# Patient Record
Sex: Female | Born: 1960 | Race: White | Hispanic: No | Marital: Married | Smoking: Never smoker
Health system: Southern US, Community
[De-identification: ages and names within clinical notes are randomized; demographics above are authoritative.]

## PROBLEM LIST (undated history)

## (undated) DIAGNOSIS — J45909 Unspecified asthma, uncomplicated: Secondary | ICD-10-CM

## (undated) DIAGNOSIS — G039 Meningitis, unspecified: Secondary | ICD-10-CM

## (undated) DIAGNOSIS — D72819 Decreased white blood cell count, unspecified: Principal | ICD-10-CM

## (undated) DIAGNOSIS — D709 Neutropenia, unspecified: Secondary | ICD-10-CM

## (undated) DIAGNOSIS — E039 Hypothyroidism, unspecified: Secondary | ICD-10-CM

## (undated) HISTORY — DX: Decreased white blood cell count, unspecified: D72.819

## (undated) HISTORY — DX: Unspecified asthma, uncomplicated: J45.909

## (undated) HISTORY — DX: Neutropenia, unspecified: D70.9

## (undated) HISTORY — DX: Hypothyroidism, unspecified: E03.9

## (undated) HISTORY — DX: Meningitis, unspecified: G03.9

---
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
## 1996-08-07 HISTORY — PX: THYROIDECTOMY, PARTIAL: SHX18

## 2004-11-22 ENCOUNTER — Ambulatory Visit (HOSPITAL_COMMUNITY): Admission: RE | Admit: 2004-11-22 | Discharge: 2004-11-22 | Payer: Self-pay | Admitting: Internal Medicine

## 2005-08-04 ENCOUNTER — Ambulatory Visit (HOSPITAL_COMMUNITY): Admission: RE | Admit: 2005-08-04 | Discharge: 2005-08-04 | Payer: Self-pay | Admitting: Internal Medicine

## 2005-08-07 HISTORY — PX: CHOLECYSTECTOMY: SHX55

## 2005-08-09 ENCOUNTER — Ambulatory Visit (HOSPITAL_COMMUNITY): Admission: RE | Admit: 2005-08-09 | Discharge: 2005-08-09 | Payer: Self-pay | Admitting: Family Medicine

## 2005-08-14 ENCOUNTER — Encounter (HOSPITAL_COMMUNITY): Admission: RE | Admit: 2005-08-14 | Discharge: 2005-09-13 | Payer: Self-pay | Admitting: Internal Medicine

## 2005-11-03 ENCOUNTER — Ambulatory Visit (HOSPITAL_COMMUNITY): Admission: RE | Admit: 2005-11-03 | Discharge: 2005-11-03 | Payer: Self-pay | Admitting: General Surgery

## 2005-11-03 ENCOUNTER — Encounter (INDEPENDENT_AMBULATORY_CARE_PROVIDER_SITE_OTHER): Payer: Self-pay | Admitting: Specialist

## 2005-12-07 ENCOUNTER — Ambulatory Visit (HOSPITAL_COMMUNITY): Admission: RE | Admit: 2005-12-07 | Discharge: 2005-12-07 | Payer: Self-pay | Admitting: Internal Medicine

## 2007-01-07 ENCOUNTER — Ambulatory Visit (HOSPITAL_COMMUNITY): Admission: RE | Admit: 2007-01-07 | Discharge: 2007-01-07 | Payer: Self-pay | Admitting: Internal Medicine

## 2007-08-08 HISTORY — PX: COLONOSCOPY: SHX174

## 2007-09-03 ENCOUNTER — Ambulatory Visit (HOSPITAL_COMMUNITY): Admission: RE | Admit: 2007-09-03 | Discharge: 2007-09-03 | Payer: Self-pay | Admitting: General Surgery

## 2008-01-29 ENCOUNTER — Ambulatory Visit (HOSPITAL_COMMUNITY): Admission: RE | Admit: 2008-01-29 | Discharge: 2008-01-29 | Payer: Self-pay | Admitting: Internal Medicine

## 2009-01-29 ENCOUNTER — Ambulatory Visit (HOSPITAL_COMMUNITY): Admission: RE | Admit: 2009-01-29 | Discharge: 2009-01-29 | Payer: Self-pay | Admitting: Internal Medicine

## 2009-03-02 ENCOUNTER — Ambulatory Visit (HOSPITAL_COMMUNITY): Admission: RE | Admit: 2009-03-02 | Discharge: 2009-03-02 | Payer: Self-pay | Admitting: Internal Medicine

## 2010-01-31 ENCOUNTER — Ambulatory Visit (HOSPITAL_COMMUNITY): Admission: RE | Admit: 2010-01-31 | Discharge: 2010-01-31 | Payer: Self-pay | Admitting: Internal Medicine

## 2010-02-22 ENCOUNTER — Ambulatory Visit (HOSPITAL_COMMUNITY): Admission: RE | Admit: 2010-02-22 | Discharge: 2010-02-22 | Payer: Self-pay | Admitting: Internal Medicine

## 2010-10-22 LAB — BLOOD GAS, ARTERIAL
Acid-base deficit: 1.5 mmol/L (ref 0.0–2.0)
Bicarbonate: 22.3 mEq/L (ref 20.0–24.0)
Patient temperature: 37
TCO2: 19.6 mmol/L (ref 0–100)
pCO2 arterial: 34.5 mmHg — ABNORMAL LOW (ref 35.0–45.0)
pH, Arterial: 7.426 — ABNORMAL HIGH (ref 7.350–7.400)

## 2010-12-20 NOTE — H&P (Signed)
Stephanie Valencia, Stephanie Valencia                 ACCOUNT NO.:  [id number]   MEDICAL RECORD NO.:  [id number]          PATIENT TYPE:  AMB   LOCATION:  DAY                           FACILITY:  [facility]   PHYSICIAN:  Dalia Heading, M.D.  DATE OF BIRTH:  September 10, 1960   DATE OF ADMISSION:  DATE OF DISCHARGE:  LH                              HISTORY & PHYSICAL   CHIEF COMPLAINT:  Hematochezia.   HISTORY OF PRESENT ILLNESS:  The patient is a 50 year old white female  who is referred for endoscopic evaluation.  She needs a colonoscopy for  hematochezia.  She has been passing some blood per rectum over the past  few weeks.  No abdominal pain, weight loss, nausea, vomiting, diarrhea,  constipation, or melena have been noted.  She has never had a  colonoscopy.  There is no family history of colon carcinoma.   PAST MEDICAL HISTORY:  Includes hypertension.   PAST SURGICAL HISTORY:  Laparoscopic cholecystectomy, thyroidectomy, C-  section, tonsillectomy.   CURRENT MEDICATIONS:  Loestrin.   ALLERGIES:  BACTRIM AND CATGUT.   REVIEW OF SYSTEMS:  Noncontributory.   PHYSICAL EXAMINATION:  GENERAL:  The patient is a well-developed, well-  nourished white female in no acute distress.  LUNGS:  Clear to auscultation with equal breath sounds bilaterally.  HEART:  Examination reveals regular rate and rhythm without history, S4,  murmurs.  ABDOMEN:  Soft, nontender, nondistended.  No hepatosplenomegaly or  masses noted.  RECTAL:  Examination was deferred to the procedure.   IMPRESSION:  Hematochezia.   PLAN:  The patient is scheduled for colonoscopy on September 03, 2007.  The risks and benefits of the procedure, including bleeding and  perforation, were fully explained to the patient, who gave informed  consent.      Dalia Heading, M.D.  Electronically Signed     MAJ/MEDQ  D:  08/27/2007  T:  08/27/2007  Job:  [id number]   cc:   Madelin Rear. Sherwood Gambler, MD  Fax: [phone]

## 2010-12-23 NOTE — Op Note (Signed)
Stephanie Valencia, Stephanie Valencia                 ACCOUNT NO.:  [id number]   MEDICAL RECORD NO.:  [id number]          PATIENT TYPE:  AMB   LOCATION:  DAY                           FACILITY:  [facility]   PHYSICIAN:  Dalia Heading, M.D.  DATE OF BIRTH:  21-Oct-1960   DATE OF PROCEDURE:  11/03/2005  DATE OF DISCHARGE:                                 OPERATIVE REPORT   PREOPERATIVE DIAGNOSIS:  Cholecystitis, cholelithiasis.   POSTOPERATIVE DIAGNOSIS:  Cholecystitis, cholelithiasis.   PROCEDURE:  Laparoscopic cholecystectomy.   SURGEON:  Dr. Franky Macho.   ASSISTANT:  Dr. Arna Snipe.   ANESTHESIA:  General endotracheal.   INDICATIONS:  The patient is a 50 year old white female who presents with a  biliary colic secondary to cholelithiasis.  Risks and benefits of the  procedure including bleeding, infection, hepatobiliary injury and the  possibility of an open procedure were fully explained to the patient, who  gave informed consent.   PROCEDURE NOTE:  The patient was placed in supine position.  After induction  of general endotracheal anesthesia, the abdomen was prepped and draped using  the usual sterile technique with Betadine.  Surgical site confirmation was  performed.   An infraumbilical incision was made down to fascia.  Veress needle was  introduced into the abdominal cavity and confirmation of placement was done  using the saline drop test.  The abdomen was then insufflated to 16 mmHg  pressure.  An 11-mm trocar was introduced into the abdominal cavity under  direct visualization without difficulty.  The patient was placed in reversed  Trendelenburg position.  An additional 11-mm trocar was placed epigastric  region, and 5-mm trocars were placed in the right upper quadrant and right  flank regions.  Liver was inspected and noted to normal limits.  The  gallbladder was retracted superiorly and laterally.  Dissection was begun  around the infundibulum of the gallbladder.  The cystic  duct was first  identified.  Its juncture to the infundibulum fully identified.  Endoclips  were placed proximally and distally on cystic duct, and the cystic duct was  divided.  This was likewise done to the cystic artery.  The gallbladder then  freed away from the gallbladder fossa using Bovie electrocautery.  The  gallbladder delivered through the epigastric trocar site using an EndoCatch  bag.  The gallbladder fossa was inspected, and no abnormal bleeding or bile  leakage was noted.  Surgicel was placed in the gallbladder fossa.  All fluid  and air were then evacuated from the abdominal cavity prior to removal of  the trocars.   All wounds were irrigated with normal saline.  All wounds were injected with  0.5%Sensorcaine.  The infraumbilical fascia was reapproximated using a 0  Vicryl interrupted suture.  All skin incisions were closed using staples.  Betadine ointment and dry sterile dressings were applied.   All tape and needle counts were correct at the end of the procedure. The  patient was extubated in the operating room and went back to recovery room  awake in stable condition.   COMPLICATIONS:  None.  SPECIMEN:  Gallbladder.   BLOOD LOSS:  Minimal.      Dalia Heading, M.D.  Electronically Signed     MAJ/MEDQ  D:  11/03/2005  T:  11/04/2005  Job:  [id number]

## 2010-12-23 NOTE — H&P (Signed)
Stephanie Valencia, Stephanie Valencia                 ACCOUNT NO.:  [id number]   MEDICAL RECORD NO.:  [id number]          PATIENT TYPE:  AMB   LOCATION:  DAY                           FACILITY:  [facility]   PHYSICIAN:  Dalia Heading, M.D.  DATE OF BIRTH:  11/18/1960   DATE OF ADMISSION:  DATE OF DISCHARGE:  LH                                HISTORY & PHYSICAL   CHIEF COMPLAINT:  Cholecystitis, cholelithiasis.   HISTORY OF PRESENT ILLNESS:  The patient is a 50 year old white female who  was referred for evaluation and treatment of biliary colic secondary to  cholelithiasis.  She has been having intermittent episodes of right upper  quadrant abdominal pain with radiation to the right flank, nausea,  indigestion and bloating for many months.  Fatty food makes it worse.  No  fever, chills or jaundice have been noted.   PAST MEDICAL HISTORY:  Hypertension.   PAST SURGICAL HISTORY:  Tonsillectomy, C-section, thyroidectomy.   CURRENT MEDICATIONS:  ___________ 1.5 - 30 p.o. q.d., Nexium 40 mg p.o.  b.i.d.   ALLERGIES:  BACTRIM and CATGUT SUTURES.   REVIEW OF SYSTEMS:  The patient denies drinking or smoking.   PHYSICAL EXAMINATION:  GENERAL:  On physical examination, the patient is a  well-developed, well-nourished white female in no acute distress.  HEENT:  HEENT examination reveals no scleral icterus.  LUNGS:  Clear to auscultation with equal breath sounds bilaterally.  Heart  examination reveals regular rate and rhythm without S3, S4 or murmurs.  ABDOMEN:  The abdomen is soft with slight tenderness in the right upper  quadrant to palpation.  No hepatosplenomegaly, masses or hernias are  identified.   STUDIES:  Ultrasound of the gallbladder reveals sludge with a normal common  bile duct.   IMPRESSION:  Biliary colic, cholelithiasis.   PLAN:  The patient is scheduled for laparoscopic cholecystectomy on November 03, 2005.  Risks and benefits of the procedure including bleeding,  infection, hepatobiliary  injury and the possibility of an open procedure  were fully explained to the patient, who gave informed consent.      Dalia Heading, M.D.  Electronically Signed     MAJ/MEDQ  D:  11/02/2005  T:  11/02/2005  Job:  [id number]   cc:   [facility]  Fax: [phone]   Madelin Rear. Sherwood Gambler, MD  Fax: [phone]

## 2011-01-09 ENCOUNTER — Other Ambulatory Visit (HOSPITAL_COMMUNITY): Payer: Self-pay | Admitting: Internal Medicine

## 2011-01-09 DIAGNOSIS — Z139 Encounter for screening, unspecified: Secondary | ICD-10-CM

## 2011-02-14 ENCOUNTER — Ambulatory Visit (HOSPITAL_COMMUNITY)
Admission: RE | Admit: 2011-02-14 | Discharge: 2011-02-14 | Disposition: A | Discharge status: Home / Self Care | Payer: BC Managed Care – PPO | Source: Ambulatory Visit | Attending: Internal Medicine | Admitting: Internal Medicine

## 2011-02-14 DIAGNOSIS — Z139 Encounter for screening, unspecified: Secondary | ICD-10-CM

## 2011-02-14 DIAGNOSIS — Z1231 Encounter for screening mammogram for malignant neoplasm of breast: Secondary | ICD-10-CM | POA: Insufficient documentation

## 2011-07-24 ENCOUNTER — Other Ambulatory Visit (HOSPITAL_COMMUNITY): Payer: Self-pay | Admitting: Internal Medicine

## 2011-07-24 ENCOUNTER — Ambulatory Visit (HOSPITAL_COMMUNITY)
Admission: RE | Admit: 2011-07-24 | Discharge: 2011-07-24 | Disposition: A | Discharge status: Home / Self Care | Payer: BC Managed Care – PPO | Source: Ambulatory Visit | Attending: Internal Medicine | Admitting: Internal Medicine

## 2011-07-24 DIAGNOSIS — J069 Acute upper respiratory infection, unspecified: Secondary | ICD-10-CM | POA: Insufficient documentation

## 2011-07-24 DIAGNOSIS — J189 Pneumonia, unspecified organism: Secondary | ICD-10-CM

## 2011-07-24 DIAGNOSIS — R059 Cough, unspecified: Secondary | ICD-10-CM | POA: Insufficient documentation

## 2011-07-24 DIAGNOSIS — R05 Cough: Secondary | ICD-10-CM | POA: Insufficient documentation

## 2012-01-18 ENCOUNTER — Other Ambulatory Visit (HOSPITAL_COMMUNITY): Payer: Self-pay | Admitting: Internal Medicine

## 2012-01-18 DIAGNOSIS — Z139 Encounter for screening, unspecified: Secondary | ICD-10-CM

## 2012-02-16 ENCOUNTER — Ambulatory Visit (HOSPITAL_COMMUNITY)
Admission: RE | Admit: 2012-02-16 | Discharge: 2012-02-16 | Disposition: A | Discharge status: Home / Self Care | Payer: BC Managed Care – PPO | Source: Ambulatory Visit | Attending: Internal Medicine | Admitting: Internal Medicine

## 2012-02-16 DIAGNOSIS — Z139 Encounter for screening, unspecified: Secondary | ICD-10-CM

## 2012-02-16 DIAGNOSIS — Z1231 Encounter for screening mammogram for malignant neoplasm of breast: Secondary | ICD-10-CM | POA: Insufficient documentation

## 2013-01-27 ENCOUNTER — Other Ambulatory Visit (HOSPITAL_COMMUNITY): Payer: Self-pay | Admitting: Internal Medicine

## 2013-01-27 DIAGNOSIS — Z139 Encounter for screening, unspecified: Secondary | ICD-10-CM

## 2013-02-17 ENCOUNTER — Ambulatory Visit (HOSPITAL_COMMUNITY)
Admission: RE | Admit: 2013-02-17 | Discharge: 2013-02-17 | Disposition: A | Discharge status: Home / Self Care | Payer: BC Managed Care – PPO | Source: Ambulatory Visit | Attending: Internal Medicine | Admitting: Internal Medicine

## 2013-02-17 DIAGNOSIS — Z1231 Encounter for screening mammogram for malignant neoplasm of breast: Secondary | ICD-10-CM | POA: Insufficient documentation

## 2013-02-17 DIAGNOSIS — Z139 Encounter for screening, unspecified: Secondary | ICD-10-CM

## 2013-02-26 ENCOUNTER — Encounter (HOSPITAL_COMMUNITY): Payer: Self-pay

## 2013-02-26 ENCOUNTER — Encounter (HOSPITAL_COMMUNITY): Payer: BC Managed Care – PPO | Attending: Hematology and Oncology

## 2013-02-26 VITALS — BP 129/79 | HR 81 | Temp 98.2°F | Resp 18 | Ht 63.5 in | Wt 144.3 lb

## 2013-02-26 DIAGNOSIS — D709 Neutropenia, unspecified: Secondary | ICD-10-CM | POA: Insufficient documentation

## 2013-02-26 LAB — COMPREHENSIVE METABOLIC PANEL
ALT: 15 U/L (ref 0–35)
Alkaline Phosphatase: 58 U/L (ref 39–117)
BUN: 15 mg/dL (ref 6–23)
CO2: 32 mEq/L (ref 19–32)
GFR calc Af Amer: >90 mL/min (ref >90)
GFR calc non Af Amer: 82 mL/min — ABNORMAL LOW (ref >90)
Glucose, Bld: 89 mg/dL (ref 70–99)
Potassium: 3.7 mEq/L (ref 3.5–5.1)
Sodium: 139 mEq/L (ref 135–145)
Total Bilirubin: 0.9 mg/dL (ref 0.3–1.2)

## 2013-02-26 LAB — CBC WITH DIFFERENTIAL/PLATELET
Eosinophils Absolute: 0.1 10*3/uL (ref 0.0–0.7)
Hemoglobin: 13.9 g/dL (ref 12.0–15.0)
Lymphocytes Relative: 43 % (ref 12–46)
Lymphs Abs: 1.5 10*3/uL (ref 0.7–4.0)
MCH: 30.2 pg (ref 26.0–34.0)
MCV: 90.2 fL (ref 78.0–100.0)
Monocytes Relative: 8 % (ref 3–12)
Neutrophils Relative %: 47 % (ref 43–77)
Platelets: 244 10*3/uL (ref 150–400)
RBC: 4.61 MIL/uL (ref 3.87–5.11)
WBC: 3.6 10*3/uL — ABNORMAL LOW (ref 4.0–10.5)

## 2013-02-26 NOTE — Patient Instructions (Addendum)
.  [facility] Discharge Instructions  RECOMMENDATIONS MADE BY THE CONSULTANT AND ANY TEST RESULTS WILL BE SENT TO YOUR REFERRING PHYSICIAN.  EXAM FINDINGS BY THE PHYSICIAN TODAY AND SIGNS OR SYMPTOMS TO REPORT TO CLINIC OR PRIMARY PHYSICIAN:  Labs today and then weekly  This may be a normal finding for you. 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  To afford each patient quality time with our providers, please arrive at least 15 minutes before your scheduled appointment time.  With your help, our goal is to use those 15 minutes to complete the necessary work-up to ensure our physicians have the information they need to help with your evaluation and healthcare recommendations.    Effective January 1st, 2014, we ask that you re-schedule your appointment with our physicians should you arrive 10 or more minutes late for your appointment.  We strive to give you quality time with our providers, and arriving late affects you and other patients whose appointments are after yours.    Again, thank you for choosing [facility].  Our hope is that these requests will decrease the amount of time that you wait before being seen by our physicians.       _____________________________________________________________  Should you have questions after your visit to [facility], please contact our office at [phone] between the hours of 8:30 a.m. and 5:00 p.m.  Voicemails left after 4:30 p.m. will not be returned until the following business day.  For prescription refill requests, have your pharmacy contact our office with your prescription refill request.

## 2013-02-26 NOTE — Progress Notes (Signed)
Consepcion L Postlethwait presented for labwork. Labs per MD order drawn via Peripheral Line 25 gauge needle inserted in lt ac.  Good blood return present. Procedure without incident.  Needle removed intact. Patient tolerated procedure well.

## 2013-02-26 NOTE — Progress Notes (Addendum)
Patient History and Physical   Stephanie Valencia [id number] [birth year] 51 y.o. 02/26/2013  Referring MD: Elfredia Nevins M.D.   Chief Complaint: Neutropenia  HPI:  Stephanie Valencia is a 65 year old woman, in generally good health who was referred by Dr. Sherwood Gambler for concern of neutropenia.  Patient tells me that she usually has a yearly  history or physical and each time she has been noted to have a low white count.  This effect I do see, that CBC was done dated 02/23/2012 which showed a WBC of 2.8k and absolute neutrophil count of 0.8 thousand normal hemoglobin of 13.8g/dL and normal platelet count 261,000. More recently patient had a CBC on 02/21/2013 which will WBC of 2.2 K absolute granulocyte of 1000, otherwise normal differential wbc counts .  At this time she was also noted to have hemoglobin of 14g/dL and platelet count 147,829.  Overall she tells me that she has not had any chronic or recurrent infections except for UTI which she says she usually gets treated for over the last 4-5 years when she goes for her routine physical.  She states that she completed a course of Cipro but was told that she still had the infection.  Regardless, she denies any urinary symptoms prior to or post antibiotics therapy. She did state history of pneumonia in the recent past reportedly 2 years ago. 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  I do not have historical CBC results beyond July 2013.  Patient denies fever or chills, denies lymphadenopathy, denies any significant weight loss or night sweats.  PMH: Past Medical History  Diagnosis Date  . Meningitis 1962    cut down for IV's  . Asthma     uses inhaler about twice a year    Past Surgical History  Procedure Laterality Date  . Tonsillectomy  1962  . Cesarean section  1982  . Thyroidectomy, partial Left 1998  . Colonoscopy  2009    Allergies: Allergies  not on file  Medications: Current outpatient prescriptions:calcium carbonate (OS-CAL) 600 MG TABS, Take 600 mg by mouth daily with breakfast., Disp: , Rfl: ;  cholecalciferol (VITAMIN D) 400 UNITS TABS, Take 400 Units by mouth daily., Disp: , Rfl: ;  fish oil-omega-3 fatty acids 1000 MG capsule, Take 2 g by mouth daily. Takes 1-2 gms daily, Disp: , Rfl: ;  levothyroxine (SYNTHROID, LEVOTHROID) 25 MCG tablet, Take 25 mcg by mouth daily before breakfast., Disp: , Rfl:  Lysine 500 MG TABS, Take 2 tablets by mouth daily., Disp: , Rfl: ;  magnesium oxide (MAG-OX) 400 MG tablet, Take 400 mg by mouth daily., Disp: , Rfl: ;  Multiple Vitamins-Minerals (EQ COMPLETE MULTIVIT ADULT 50+ PO), Take 1 capsule by mouth., Disp: , Rfl: ;  pramipexole (MIRAPEX) 0.25 MG tablet, Take 0.25 mg by mouth 3 (three) times daily. Takes 1 and 1/2 tablets daily, Disp: , Rfl:    Social History:   reports that she has never smoked. She has never used smokeless tobacco. She reports that she does not drink alcohol or use illicit drugs. 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  She is married with a child and lives with her husband.  She denies any history of IV drug use or high risk lifestyle or HIV risk factors.  Family History: She denies any family history of malignancy except in the  paternal grandmother who had lung cancer Patient's mother has history of anemia which patient does not know the cause.   Review of Systems: 14 point review of system is as in the history above otherwise negative.  Physical Exam: Blood pressure 129/79, pulse 81, temperature 98.2 F (36.8 C), temperature source Oral, resp. rate 18, height 5' 3.5" (1.613 m), weight 144 lb 4.8 oz (65.454 kg). GENERAL: No distress, well nourished.  SKIN:  No rashes or significant lesions  HEAD: Normocephalic, No masses, lesions, tenderness or abnormalities  EYES: Conjunctiva are pink and non-injected  ENT: External ears normal  ,lips, buccal mucosa, and tongue normal and mucous membranes are moist  LYMPH: No palpable lymphadenopathy, in the neck supraclavicular areas or axilla. LUNGS: clear to auscultation , no crackles or wheezes HEART: regular rate & rhythm, no murmurs, no gallops, S1 normal and S2 normal  ABDOMEN: Abdomen soft, non-tender, normal bowel sounds, no masses or organomegaly and no hepatosplenomegaly palpable. EXTREMITIES: No edema, no skin discoloration or tenderness NEURO: alert & oriented , no focal motor/sensory deficits.     Lab Results: Labs as a reference in the history above.  Today's labs are pending    Impression: Stephanie Valencia has neutropenia which dates back to at least 2013.  She feels well and has not had any unusual or recurrent infection to suggest immunodeficiency.  There is a good chance that her neutropenia is constitutional, however this requires  longer span of data to support the diagnosis. I have only 1 year interval CBC available at this time. I will like to do weekly CBC for the next 4-6 weeks to monitor her trend.  In most cases patients with absolute neutrophil count greater than 500 rarely on a serious risk of infection.  I think is also relevant to exclude other causes or neutropenia such as hepatitis C , HIV in addition to vitamin B12 which in most cases will result in pancytopenia. Patient is agreeable to these tests.   Recommendations: 1.  CBC/CMP/LDH/hep C antibody/HIV-1 and 2 screening/vitamin B12/review of peripheral blood smear ordered. 2.  A she'll return to clinic in 6 weeks to review the results. 3.  She will obtain weekly CBCs x 6 weeks starting next week.   Thank you Dr. Sherwood Gambler for this referral.  All questions were satisfactorily answered.She knows to call if she has any concern.  I spent more than 50% counseling the patient face to face. The total time spent in the appointment was 45 minutes    Sharia Reeve, Ardelle Balls, MD FACP. Hematology/Oncology.     Addendum: Review of peripheral blood smear showed a few reactive lymphocytes, segmented mature neutrophils and normal red blood cells in morphology.

## 2013-02-27 LAB — VITAMIN B12: Vitamin B-12: 637 pg/mL (ref 211–911)

## 2013-03-05 ENCOUNTER — Encounter (HOSPITAL_BASED_OUTPATIENT_CLINIC_OR_DEPARTMENT_OTHER): Payer: BC Managed Care – PPO

## 2013-03-05 DIAGNOSIS — D709 Neutropenia, unspecified: Secondary | ICD-10-CM

## 2013-03-05 LAB — CBC WITH DIFFERENTIAL/PLATELET
Basophils Relative: 1 % (ref 0–1)
Eosinophils Relative: 2 % (ref 0–5)
HCT: 42.5 % (ref 36.0–46.0)
Hemoglobin: 13.9 g/dL (ref 12.0–15.0)
MCH: 30 pg (ref 26.0–34.0)
MCHC: 32.7 g/dL (ref 30.0–36.0)
MCV: 91.6 fL (ref 78.0–100.0)
Monocytes Absolute: 0.3 10*3/uL (ref 0.1–1.0)
Monocytes Relative: 9 % (ref 3–12)
Neutro Abs: 1.4 10*3/uL — ABNORMAL LOW (ref 1.7–7.7)
RDW: 13 % (ref 11.5–15.5)

## 2013-03-05 NOTE — Progress Notes (Signed)
Labs drawn today for cbc/diff 

## 2013-03-12 ENCOUNTER — Encounter (HOSPITAL_COMMUNITY): Payer: BC Managed Care – PPO | Attending: Hematology and Oncology

## 2013-03-12 DIAGNOSIS — D709 Neutropenia, unspecified: Secondary | ICD-10-CM | POA: Insufficient documentation

## 2013-03-12 LAB — DIFFERENTIAL
Basophils Absolute: 0 10*3/uL (ref 0.0–0.1)
Basophils Relative: 0 % (ref 0–1)
Eosinophils Absolute: 0 10*3/uL (ref 0.0–0.7)
Monocytes Absolute: 0.3 10*3/uL (ref 0.1–1.0)
Monocytes Relative: 9 % (ref 3–12)
Neutrophils Relative %: 33 % — ABNORMAL LOW (ref 43–77)

## 2013-03-12 LAB — CBC
Hemoglobin: 13.9 g/dL (ref 12.0–15.0)
MCH: 30.4 pg (ref 26.0–34.0)
MCHC: 33.6 g/dL (ref 30.0–36.0)
RDW: 12.9 % (ref 11.5–15.5)

## 2013-03-12 NOTE — Progress Notes (Signed)
Labs drawn today for cbc/diff 

## 2013-03-19 ENCOUNTER — Encounter (HOSPITAL_BASED_OUTPATIENT_CLINIC_OR_DEPARTMENT_OTHER): Payer: BC Managed Care – PPO

## 2013-03-19 DIAGNOSIS — D709 Neutropenia, unspecified: Secondary | ICD-10-CM

## 2013-03-19 LAB — DIFFERENTIAL
Basophils Absolute: 0 10*3/uL (ref 0.0–0.1)
Basophils Relative: 0 % (ref 0–1)
Eosinophils Absolute: 0.1 10*3/uL (ref 0.0–0.7)
Eosinophils Relative: 2 % (ref 0–5)
Lymphocytes Relative: 57 % — ABNORMAL HIGH (ref 12–46)
Monocytes Absolute: 0.3 10*3/uL (ref 0.1–1.0)

## 2013-03-19 LAB — CBC
MCH: 30.1 pg (ref 26.0–34.0)
MCHC: 33.3 g/dL (ref 30.0–36.0)
Platelets: 245 10*3/uL (ref 150–400)
RBC: 4.69 MIL/uL (ref 3.87–5.11)

## 2013-03-19 NOTE — Progress Notes (Signed)
Labs drawn today for cbc/diff 

## 2013-03-26 ENCOUNTER — Encounter (HOSPITAL_BASED_OUTPATIENT_CLINIC_OR_DEPARTMENT_OTHER): Payer: BC Managed Care – PPO

## 2013-03-26 DIAGNOSIS — D709 Neutropenia, unspecified: Secondary | ICD-10-CM

## 2013-03-26 LAB — CBC WITH DIFFERENTIAL/PLATELET
Basophils Absolute: 0 10*3/uL (ref 0.0–0.1)
Basophils Relative: 1 % (ref 0–1)
HCT: 40.9 % (ref 36.0–46.0)
MCHC: 33.5 g/dL (ref 30.0–36.0)
Monocytes Absolute: 0.3 10*3/uL (ref 0.1–1.0)
Neutro Abs: 1.4 10*3/uL — ABNORMAL LOW (ref 1.7–7.7)
Platelets: 241 10*3/uL (ref 150–400)
RDW: 12.8 % (ref 11.5–15.5)

## 2013-03-26 NOTE — Progress Notes (Signed)
Stephanie Valencia presented for labwork. Labs per MD order drawn via Peripheral Line 23 gauge needle inserted in left AC  Good blood return present. Procedure without incident.  Needle removed intact. Patient tolerated procedure well.   

## 2013-04-02 ENCOUNTER — Encounter (HOSPITAL_BASED_OUTPATIENT_CLINIC_OR_DEPARTMENT_OTHER): Payer: BC Managed Care – PPO

## 2013-04-02 DIAGNOSIS — D709 Neutropenia, unspecified: Secondary | ICD-10-CM

## 2013-04-02 LAB — CBC WITH DIFFERENTIAL/PLATELET
Basophils Absolute: 0 10*3/uL (ref 0.0–0.1)
Basophils Relative: 1 % (ref 0–1)
Hemoglobin: 12.8 g/dL (ref 12.0–15.0)
MCHC: 33.7 g/dL (ref 30.0–36.0)
Neutro Abs: 1.5 10*3/uL — ABNORMAL LOW (ref 1.7–7.7)
Neutrophils Relative %: 43 % (ref 43–77)
RDW: 12.8 % (ref 11.5–15.5)

## 2013-04-02 NOTE — Progress Notes (Signed)
Stephanie Valencia presented for labwork. Labs per MD order drawn via Peripheral Line 23 gauge needle inserted in left antecubital.  Good blood return present. Procedure without incident.  Needle removed intact. Patient tolerated procedure well.

## 2013-04-09 ENCOUNTER — Ambulatory Visit (HOSPITAL_COMMUNITY): Payer: BC Managed Care – PPO | Admitting: Oncology

## 2013-04-11 ENCOUNTER — Encounter (HOSPITAL_COMMUNITY): Payer: BC Managed Care – PPO | Attending: Hematology and Oncology | Admitting: Oncology

## 2013-04-11 ENCOUNTER — Encounter (HOSPITAL_COMMUNITY): Payer: Self-pay | Admitting: Oncology

## 2013-04-11 VITALS — BP 106/71 | HR 76 | Temp 98.1°F | Resp 16 | Wt 143.0 lb

## 2013-04-11 DIAGNOSIS — D72819 Decreased white blood cell count, unspecified: Secondary | ICD-10-CM

## 2013-04-11 DIAGNOSIS — D709 Neutropenia, unspecified: Secondary | ICD-10-CM

## 2013-04-11 HISTORY — DX: Decreased white blood cell count, unspecified: D72.819

## 2013-04-11 HISTORY — DX: Neutropenia, unspecified: D70.9

## 2013-04-11 NOTE — Patient Instructions (Addendum)
[facility] Discharge Instructions  RECOMMENDATIONS MADE BY THE CONSULTANT AND ANY TEST RESULTS WILL BE SENT TO YOUR REFERRING PHYSICIAN.  EXAM FINDINGS BY THE PHYSICIAN TODAY AND SIGNS OR SYMPTOMS TO REPORT TO CLINIC OR PRIMARY PHYSICIAN: Exam and discussion by Stephanie Anes, PA-C  MEDICATIONS PRESCRIBED:  none  INSTRUCTIONS GIVEN AND DISCUSSED: Report night sweats, recurring infections, fevers, etc.  SPECIAL INSTRUCTIONS/FOLLOW-UP: Follow-up with lab work and visit in 6 months.  Thank you for choosing [facility] to provide your oncology and hematology care.  To afford each patient quality time with our providers, please arrive at least 15 minutes before your scheduled appointment time.  With your help, our goal is to use those 15 minutes to complete the necessary work-up to ensure our physicians have the information they need to help with your evaluation and healthcare recommendations.    Effective January 1st, 2014, we ask that you re-schedule your appointment with our physicians should you arrive 10 or more minutes late for your appointment.  We strive to give you quality time with our providers, and arriving late affects you and other patients whose appointments are after yours.    Again, thank you for choosing [facility].  Our hope is that these requests will decrease the amount of time that you wait before being seen by our physicians.       _____________________________________________________________  Should you have questions after your visit to [facility], please contact our office at [phone] between the hours of 8:30 a.m. and 5:00 p.m.  Voicemails left after 4:30 p.m. will not be returned until the following business day.  For prescription refill requests, have your pharmacy contact our office with your prescription refill request.

## 2013-04-11 NOTE — Progress Notes (Signed)
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 [place] [address]  Neutropenia, unspecified - Plan: albuterol (PROVENTIL HFA;VENTOLIN HFA) 108 (90 BASE) MCG/ACT inhaler, acetaminophen (TYLENOL) 500 MG tablet, ibuprofen (ADVIL,MOTRIN) 200 MG tablet, Chlorpheniramine Maleate (EQ CHLORTABS PO), CBC with Differential  CURRENT THERAPY: Observation  INTERVAL HISTORY: Stephanie Valencia 52 y.o. female returns for  regular  visit for followup of leukopenia with neutropenia.  I personally reviewed and went over laboratory results with the patient.  She underwent CBCs weekly x 6 weeks and she had stable leukopenia (ranging between 3.3 and 3.7) with neutropenia (ranging between 1.1 and 1.7).  Her Hgb and platelets are WNL.  Vitamin B12 was WNL with LDH.  HIV was non-reactive and HCV antibody was negative as well.  Metabolic panel was unimpressive.   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   She denies any antibiotic requirements over the past few weeks.  She denies any infectious complaints today.  Hematologically, she denies any complaints and ROS questioning is negative.   I provided her education regarding her neutropenia and this is most likely constitutional and not in any need of intervention.  Past Medical History  Diagnosis Date  . Meningitis 1962    cut down for IV's  . Asthma     uses inhaler about twice a year  . Neutropenia   . Neutropenia, unspecified 04/11/2013    has Neutropenia, unspecified on her problem list.     has no allergies on file.  [name] does not currently have medications on file.  Past Surgical History  Procedure Laterality Date  . Tonsillectomy  1962  . Cesarean section  1982  . Thyroidectomy, partial Left 1998  . Colonoscopy  2009  . Cholecystectomy  2007    Denies any headaches, dizziness, double vision, fevers, chills, night sweats, nausea, vomiting, diarrhea, constipation, chest pain, heart  palpitations, shortness of breath, blood in stool, black tarry stool, urinary pain, urinary burning, urinary frequency, hematuria.   PHYSICAL EXAMINATION  ECOG PERFORMANCE STATUS: 0 - Asymptomatic  Filed Vitals:   04/11/13 1524  BP: 106/71  Pulse: 76  Temp: 98.1 F (36.7 C)  Resp: 16    GENERAL:alert, healthy, no distress, well nourished, well developed, comfortable, cooperative and smiling SKIN: skin color, texture, turgor are normal, no rashes or significant lesions HEAD: Normocephalic, No masses, lesions, tenderness or abnormalities EYES: normal, PERRLA, EOMI, Conjunctiva are pink and non-injected EARS: External ears normal OROPHARYNX:lips, buccal mucosa, and tongue normal and mucous membranes are moist  NECK: supple, trachea midline LYMPH:  not examined BREAST:not examined LUNGS: not examined HEART: not examined ABDOMEN:not examined BACK: not examined EXTREMITIES:no skin discoloration, no clubbing, no cyanosis  NEURO: alert & oriented x 3 with fluent speech, no focal motor/sensory deficits, gait normal    LABORATORY DATA: CBC    Component Value Date/Time   WBC 3.5* 04/02/2013 1544   RBC 4.21 04/02/2013 1544   HGB 12.8 04/02/2013 1544   HCT 38.0 04/02/2013 1544   PLT 218 04/02/2013 1544   MCV 90.3 04/02/2013 1544   MCH 30.4 04/02/2013 1544   MCHC 33.7 04/02/2013 1544   RDW 12.8 04/02/2013 1544   LYMPHSABS 1.7 04/02/2013 1544   MONOABS 0.3 04/02/2013 1544   EOSABS 0.0 04/02/2013 1544   BASOSABS 0.0 04/02/2013 1544    Results for JASA, DUNDON (MRN [id number]) as of 04/11/2013 15:35  Ref. Range 02/26/2013 15:10 03/05/2013 10:02 03/12/2013 08:33 03/19/2013  08:37 03/26/2013 15:49 04/02/2013 15:44  WBC Latest Range: 4.0-10.5 K/uL 3.6 (L) 3.6 (L) 3.3 (L) 3.3 (L) 3.7 (L) 3.5 (L)  RBC Latest Range: 3.87-5.11 MIL/uL 4.61 4.64 4.57 4.69 4.54 4.21  Hemoglobin Latest Range: 12.0-15.0 g/dL 47.8 29.5 62.1 30.8 65.7 12.8  HCT Latest Range: 36.0-46.0 % 41.6 42.5 41.4 42.4 40.9 38.0  MCV Latest  Range: 78.0-100.0 fL 90.2 91.6 90.6 90.4 90.1 90.3  MCH Latest Range: 26.0-34.0 pg 30.2 30.0 30.4 30.1 30.2 30.4  MCHC Latest Range: 30.0-36.0 g/dL 84.6 96.2 95.2 84.1 32.4 33.7  RDW Latest Range: 11.5-15.5 % 13.0 13.0 12.9 12.8 12.8 12.8  Platelets Latest Range: 150-400 K/uL 244 227 241 245 241 218     ASSESSMENT:  1. Stable leukopenia with neutropenia.  Not in need of any intervention.  Negative HCV and HIV testing.  LDH is WNL.  Vitamin B12 was WNL as well.   Patient Active Problem List   Diagnosis Date Noted  . Neutropenia, unspecified 04/11/2013    PLAN:  1. I personally reviewed and went over laboratory results with the patient. 2. Patient education regarding neutropenia and leukopenia 3. Labs in 6 months: CBC diff 4. Return in 6 months for follow-up.   THERAPY PLAN:  Her neutropenia is most likely benign.  We will develop stability over the next 2-3 years and then release her from the clinic if all is well and stable.  All questions were answered. The patient knows to call the clinic with any problems, questions or concerns. We can certainly see the patient much sooner if necessary.  Patient and plan discussed with Dr. Erline Hau and he is in agreement with the aforementioned.   KEFALAS,THOMAS

## 2013-10-10 ENCOUNTER — Other Ambulatory Visit (HOSPITAL_COMMUNITY): Payer: BC Managed Care – PPO

## 2013-10-16 NOTE — Progress Notes (Signed)
Cassell SmilesFUSCO,LAWRENCE J., MD [id number] [place] [address]  Neutropenia, unspecified - Plan: CBC  CURRENT THERAPY: Observation  INTERVAL HISTORY: Stephanie Valencia 53 y.o. female returns for  regular  visit for followup of leukopenia with neutropenia.  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   I personally reviewed and went over laboratory results with the patient.  The results are noted within this dictation.  Labs were drawn today [facility]  She denies any antibiotics, hospitalizations, or illnesses over the past 6 months.  She feels well.  She denies any B symptoms including fevers, chills, night sweats (drenching), and unintentional weight loss.   Hematologically, she denies any complaints and ROS questioning is negative.    Past Medical History  Diagnosis Date  . Meningitis 1962    cut down for IV's  . Asthma     uses inhaler about twice a year  . Neutropenia   . Neutropenia, unspecified 04/11/2013    has Neutropenia, unspecified on her problem list.     is allergic to bactrim.  [name] does not currently have medications on file.  Past Surgical History  Procedure Laterality Date  . Tonsillectomy  1962  . Cesarean section  1982  . Thyroidectomy, partial Left 1998  . Colonoscopy  2009  . Cholecystectomy  2007    Denies any headaches, dizziness, double vision, fevers, chills, night sweats, nausea, vomiting, diarrhea, constipation, chest pain, heart palpitations, shortness of breath, blood in stool, black tarry stool, urinary pain, urinary burning, urinary frequency, hematuria.   PHYSICAL EXAMINATION  ECOG PERFORMANCE STATUS: 0 - Asymptomatic  Filed Vitals:   10/17/13 1513  BP: 112/76  Pulse: 76  Temp: 97.9 F (36.6 C)  Resp: 14    GENERAL:alert, healthy, no distress, well nourished, well developed, comfortable, cooperative and smiling SKIN: skin color, texture,  turgor are normal, no rashes or significant lesions HEAD: Normocephalic, No masses, lesions, tenderness or abnormalities EYES: normal, PERRLA, EOMI, Conjunctiva are pink and non-injected EARS: External ears normal OROPHARYNX:mucous membranes are moist  NECK: supple, no adenopathy, thyroid normal size, non-tender, without nodularity, no stridor, non-tender, trachea midline LYMPH:  no palpable lymphadenopathy, no hepatosplenomegaly BREAST:not examined LUNGS: clear to auscultation and percussion HEART: regular rate & rhythm, no murmurs, no gallops, S1 normal and S2 normal ABDOMEN:abdomen soft, non-tender, normal bowel sounds, no masses or organomegaly and no hepatosplenomegaly BACK: Back symmetric, no curvature., No CVA tenderness EXTREMITIES:less then 2 second capillary refill, no joint deformities, effusion, or inflammation, no edema, no skin discoloration, no clubbing, no cyanosis  NEURO: alert & oriented x 3 with fluent speech, no focal motor/sensory deficits, gait normal   LABORATORY DATA: CBC    Component Value Date/Time   WBC 3.5* 04/02/2013 1544   RBC 4.21 04/02/2013 1544   HGB 12.8 04/02/2013 1544   HCT 38.0 04/02/2013 1544   PLT 218 04/02/2013 1544   MCV 90.3 04/02/2013 1544   MCH 30.4 04/02/2013 1544   MCHC 33.7 04/02/2013 1544   RDW 12.8 04/02/2013 1544   LYMPHSABS 1.7 04/02/2013 1544   MONOABS 0.3 04/02/2013 1544   EOSABS 0.0 04/02/2013 1544   BASOSABS 0.0 04/02/2013 1544    PENDING LABS: CBC diff   RADIOGRAPHIC STUDIES:  02/18/13  *RADIOLOGY REPORT*  Clinical Data: Screening.  DIGITAL SCREENING BILATERAL MAMMOGRAM WITH CAD  Comparison: Previous exam(s).  FINDINGS:  ACR Breast Density Category c: The breast  tissue is  heterogeneously dense, which may obscure small masses.  There are no findings suspicious for malignancy.  Images were processed with CAD.  IMPRESSION:  No mammographic evidence of malignancy.  A result letter of this screening mammogram will be mailed  directly  to the patient.  RECOMMENDATION:  Screening mammogram in one year. (Code:SM-B-01Y)  BI-RADS CATEGORY 1: Negative.  Original Report Authenticated By: Beckie Salts, M.D.    ASSESSMENT:  1. Stable leukopenia with neutropenia. Not in need of any intervention. Negative HCV and HIV testing. LDH is WNL. Vitamin B12 was WNL as well. 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  Patient Active Problem List   Diagnosis Date Noted  . Neutropenia, unspecified 04/11/2013    PLAN:  1. I personally reviewed and went over laboratory results with the patient.  The results are noted within this dictation. 2. I personally reviewed and went over radiographic studies with the patient.  The results are noted within this dictation.   3. Labs in 6 months and 12 months: CBC diff 4. 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 We will prove stability over the next 12 months at which time, if all is well and stable, we will release from the clinic.    All questions were answered. The patient knows to call the clinic with any problems, questions or concerns. We can certainly see the patient much sooner if necessary.  Patient and plan discussed with Dr. Alla German and he is in agreement with the aforementioned.   KEFALAS,THOMAS 10/17/2013

## 2013-10-17 ENCOUNTER — Encounter (HOSPITAL_COMMUNITY): Payer: BC Managed Care – PPO | Attending: Oncology | Admitting: Oncology

## 2013-10-17 ENCOUNTER — Encounter (HOSPITAL_COMMUNITY): Payer: Self-pay | Admitting: Oncology

## 2013-10-17 ENCOUNTER — Encounter (HOSPITAL_COMMUNITY): Payer: BC Managed Care – PPO

## 2013-10-17 ENCOUNTER — Ambulatory Visit (HOSPITAL_COMMUNITY): Payer: BC Managed Care – PPO | Admitting: Oncology

## 2013-10-17 VITALS — BP 112/76 | HR 76 | Temp 97.9°F | Resp 14

## 2013-10-17 DIAGNOSIS — D709 Neutropenia, unspecified: Secondary | ICD-10-CM

## 2013-10-17 DIAGNOSIS — Z09 Encounter for follow-up examination after completed treatment for conditions other than malignant neoplasm: Secondary | ICD-10-CM | POA: Insufficient documentation

## 2013-10-17 DIAGNOSIS — D72819 Decreased white blood cell count, unspecified: Secondary | ICD-10-CM

## 2013-10-17 LAB — CBC WITH DIFFERENTIAL/PLATELET
BASOS ABS: 0 10*3/uL (ref 0.0–0.1)
BASOS PCT: 0 % (ref 0–1)
EOS ABS: 0 10*3/uL (ref 0.0–0.7)
EOS PCT: 1 % (ref 0–5)
HEMATOCRIT: 39.7 % (ref 36.0–46.0)
Hemoglobin: 13.4 g/dL (ref 12.0–15.0)
Lymphocytes Relative: 49 % — ABNORMAL HIGH (ref 12–46)
Lymphs Abs: 1.6 10*3/uL (ref 0.7–4.0)
MCH: 30.5 pg (ref 26.0–34.0)
MCHC: 33.8 g/dL (ref 30.0–36.0)
MCV: 90.2 fL (ref 78.0–100.0)
MONO ABS: 0.3 10*3/uL (ref 0.1–1.0)
Monocytes Relative: 8 % (ref 3–12)
Neutro Abs: 1.4 10*3/uL — ABNORMAL LOW (ref 1.7–7.7)
Neutrophils Relative %: 42 % — ABNORMAL LOW (ref 43–77)
PLATELETS: 247 10*3/uL (ref 150–400)
RBC: 4.4 MIL/uL (ref 3.87–5.11)
RDW: 12.4 % (ref 11.5–15.5)
WBC: 3.4 10*3/uL — AB (ref 4.0–10.5)

## 2013-10-17 NOTE — Progress Notes (Unsigned)
Stephanie Valencia presented for labwork. Labs per MD order drawn via Peripheral Line 23 gauge needle inserted in left AC  Good blood return present. Procedure without incident.  Needle removed intact. Patient tolerated procedure well.

## 2013-10-17 NOTE — Patient Instructions (Signed)
[facility] Discharge Instructions  RECOMMENDATIONS MADE BY THE CONSULTANT AND ANY TEST RESULTS WILL BE SENT TO YOUR REFERRING PHYSICIAN.  EXAM FINDINGS BY THE PHYSICIAN TODAY AND SIGNS OR SYMPTOMS TO REPORT TO CLINIC OR PRIMARY PHYSICIAN: Exam and findings as discussed by Dellis Aneshomas Kefalas, PA- C.  Report fevers, recurring infections, night sweats or other concerns.  If labs remain stable, will probably discharge you from the clinic on your visit in 1 year.  MEDICATIONS PRESCRIBED:  none  INSTRUCTIONS/FOLLOW-UP: Labs in 6 and 12 months & office visit in 12 months.  Thank you for choosing [facility] to provide your oncology and hematology care.  To afford each patient quality time with our providers, please arrive at least 15 minutes before your scheduled appointment time.  With your help, our goal is to use those 15 minutes to complete the necessary work-up to ensure our physicians have the information they need to help with your evaluation and healthcare recommendations.    Effective January 1st, 2014, we ask that you re-schedule your appointment with our physicians should you arrive 10 or more minutes late for your appointment.  We strive to give you quality time with our providers, and arriving late affects you and other patients whose appointments are after yours.    Again, thank you for choosing [facility].  Our hope is that these requests will decrease the amount of time that you wait before being seen by our physicians.       _____________________________________________________________  Should you have questions after your visit to [facility], please contact our office at [phone] between the hours of 8:30 a.m. and 5:00 p.m.  Voicemails left after 4:30 p.m. will not be returned until the following business day.  For prescription refill requests, have your pharmacy contact our office with your prescription refill request.

## 2014-01-16 ENCOUNTER — Other Ambulatory Visit (HOSPITAL_COMMUNITY): Payer: Self-pay | Admitting: Internal Medicine

## 2014-01-16 DIAGNOSIS — Z139 Encounter for screening, unspecified: Secondary | ICD-10-CM

## 2014-03-02 ENCOUNTER — Ambulatory Visit (HOSPITAL_COMMUNITY)
Admission: RE | Admit: 2014-03-02 | Discharge: 2014-03-02 | Disposition: A | Discharge status: Home / Self Care | Payer: BC Managed Care – PPO | Source: Ambulatory Visit | Attending: Internal Medicine | Admitting: Internal Medicine

## 2014-03-02 DIAGNOSIS — Z139 Encounter for screening, unspecified: Secondary | ICD-10-CM

## 2014-04-17 ENCOUNTER — Encounter (HOSPITAL_COMMUNITY): Payer: BC Managed Care – PPO | Attending: Hematology and Oncology

## 2014-04-17 ENCOUNTER — Other Ambulatory Visit (HOSPITAL_COMMUNITY): Payer: BC Managed Care – PPO

## 2014-04-17 DIAGNOSIS — D709 Neutropenia, unspecified: Secondary | ICD-10-CM | POA: Diagnosis not present

## 2014-04-17 LAB — CBC
HCT: 37.9 % (ref 36.0–46.0)
HEMOGLOBIN: 12.9 g/dL (ref 12.0–15.0)
MCH: 30.4 pg (ref 26.0–34.0)
MCHC: 34 g/dL (ref 30.0–36.0)
MCV: 89.4 fL (ref 78.0–100.0)
Platelets: 241 10*3/uL (ref 150–400)
RBC: 4.24 MIL/uL (ref 3.87–5.11)
RDW: 13.2 % (ref 11.5–15.5)
WBC: 4.1 10*3/uL (ref 4.0–10.5)

## 2014-04-17 NOTE — Progress Notes (Signed)
Anjelina L Steelman's reason for visit today is for labs as scheduled per MD orders.  Venipuncture performed with a 23 gauge butterfly needle to L Antecubital.  Stephanie Valencia tolerated procedure well and without incident; questions were answered and patient was discharged.

## 2014-10-15 ENCOUNTER — Encounter (HOSPITAL_COMMUNITY): Payer: BC Managed Care – PPO | Attending: Hematology & Oncology

## 2014-10-15 DIAGNOSIS — D72819 Decreased white blood cell count, unspecified: Secondary | ICD-10-CM | POA: Insufficient documentation

## 2014-10-15 DIAGNOSIS — D709 Neutropenia, unspecified: Secondary | ICD-10-CM | POA: Diagnosis not present

## 2014-10-15 LAB — CBC
HEMATOCRIT: 40.3 % (ref 36.0–46.0)
Hemoglobin: 13.3 g/dL (ref 12.0–15.0)
MCH: 30.2 pg (ref 26.0–34.0)
MCHC: 33 g/dL (ref 30.0–36.0)
MCV: 91.4 fL (ref 78.0–100.0)
Platelets: 247 10*3/uL (ref 150–400)
RBC: 4.41 MIL/uL (ref 3.87–5.11)
RDW: 12.9 % (ref 11.5–15.5)
WBC: 3.9 10*3/uL — AB (ref 4.0–10.5)

## 2014-10-15 NOTE — Progress Notes (Signed)
Stephanie Valencia's reason for visit today is for labs as scheduled per MD orders.  Venipuncture performed with a 23 gauge butterfly needle to L Antecubital.  Stephanie Valencia tolerated procedure well and without incident; questions were answered and patient was discharged.

## 2014-10-16 ENCOUNTER — Other Ambulatory Visit (HOSPITAL_COMMUNITY): Payer: BC Managed Care – PPO

## 2014-10-16 ENCOUNTER — Encounter (HOSPITAL_COMMUNITY): Payer: Self-pay | Admitting: Oncology

## 2014-10-16 ENCOUNTER — Encounter (HOSPITAL_BASED_OUTPATIENT_CLINIC_OR_DEPARTMENT_OTHER): Payer: BC Managed Care – PPO | Admitting: Oncology

## 2014-10-16 VITALS — BP 112/75 | HR 79 | Temp 98.1°F | Resp 15 | Wt 161.3 lb

## 2014-10-16 DIAGNOSIS — D72819 Decreased white blood cell count, unspecified: Secondary | ICD-10-CM

## 2014-10-16 DIAGNOSIS — D709 Neutropenia, unspecified: Secondary | ICD-10-CM

## 2014-10-16 NOTE — Progress Notes (Signed)
Cassell SmilesFUSCO,LAWRENCE J., MD [address] [place] [address]  Chronic leukopenia  CURRENT THERAPY: Observation  INTERVAL HISTORY: Stephanie Valencia 54 y.o. female returns for followup of leukopenia with neutropenia.  It has been stable without any change historically since July 2014.  I personally reviewed and went over laboratory results with the patient.  The results are noted within this dictation.   Results for Stephanie AmelWELLS, Suzzanne L (MRN [id number]) as of 10/16/2014 09:01  Ref. Range 02/26/2013 15:10 03/05/2013 10:02 03/12/2013 08:33 03/19/2013 08:37 03/26/2013 15:49 04/02/2013 15:44 10/17/2013 14:42 03/02/2014 14:33 04/17/2014 15:48 10/15/2014 15:45  WBC Latest Range: 4.0-10.5 K/uL 3.6 (L) 3.6 (L) 3.3 (L) 3.3 (L) 3.7 (L) 3.5 (L) 3.4 (L)  4.1 3.9 (L)   Results for Stephanie AmelWELLS, Bradley L (MRN [id number]) as of 10/16/2014 09:01  Ref. Range 02/26/2013 15:10 03/05/2013 10:02 03/12/2013 08:33 03/19/2013 08:37 03/26/2013 15:49 04/02/2013 15:44 10/17/2013 14:42  NEUT# Latest Range: 1.7-7.7 K/uL 1.7 1.4 (L) 1.1 (L) 1.1 (L) 1.4 (L) 1.5 (L) 1.4 (L)    She denies any recurrent infections.  She denies any antibiotic requirements.  She denies any hospitalizations.  She denies any B symptoms.   Past Medical History  Diagnosis Date  . Meningitis 1962    cut down for IV's  . Asthma     uses inhaler about twice a year  . Neutropenia   . Neutropenia, unspecified 04/11/2013  . Chronic leukopenia 04/11/2013    has Chronic leukopenia on her problem list.     is allergic to bactrim.  [name] does not currently have medications on file.  Past Surgical History  Procedure Laterality Date  . Tonsillectomy  1962  . Cesarean section  1982  . Thyroidectomy, partial Left 1998  . Colonoscopy  2009  . Cholecystectomy  2007    Denies any headaches, dizziness, double vision, fevers, chills, night sweats, nausea, vomiting, diarrhea, constipation, chest pain, heart palpitations, shortness of breath, blood in stool, black  tarry stool, urinary pain, urinary burning, urinary frequency, hematuria.   PHYSICAL EXAMINATION  ECOG PERFORMANCE STATUS: 0 - Asymptomatic  Filed Vitals:   10/16/14 1523  BP: 112/75  Pulse: 79  Temp: 98.1 F (36.7 C)  Resp: 15    GENERAL:alert, healthy, no distress, well nourished, well developed, comfortable, cooperative and smiling SKIN: skin color, texture, turgor are normal, no rashes or significant lesions HEAD: Normocephalic, No masses, lesions, tenderness or abnormalities EYES: normal, PERRLA, EOMI, Conjunctiva are pink and non-injected EARS: External ears normal OROPHARYNX:lips, buccal mucosa, and tongue normal and mucous membranes are moist  NECK: supple, no adenopathy, thyroid normal size, non-tender, without nodularity, no stridor, non-tender, trachea midline LYMPH:  no palpable lymphadenopathy, no hepatosplenomegaly BREAST:not examined LUNGS: clear to auscultation and percussion HEART: regular rate & rhythm, no murmurs, no gallops, S1 normal and S2 normal ABDOMEN:abdomen soft, non-tender, obese, normal bowel sounds, no masses or organomegaly and no hepatosplenomegaly BACK: Back symmetric, no curvature., No CVA tenderness EXTREMITIES:less then 2 second capillary refill, no joint deformities, effusion, or inflammation, no edema, no skin discoloration, no clubbing, no cyanosis  NEURO: alert & oriented x 3 with fluent speech, no focal motor/sensory deficits, gait normal   LABORATORY DATA: CBC    Component Value Date/Time   WBC 3.9* 10/15/2014 1545   RBC 4.41 10/15/2014 1545   HGB 13.3 10/15/2014 1545   HCT 40.3 10/15/2014 1545   PLT 247 10/15/2014 1545   MCV 91.4 10/15/2014 1545   MCH 30.2 10/15/2014 1545  MCHC 33.0 10/15/2014 1545   RDW 12.9 10/15/2014 1545   LYMPHSABS 1.6 10/17/2013 1442   MONOABS 0.3 10/17/2013 1442   EOSABS 0.0 10/17/2013 1442   BASOSABS 0.0 10/17/2013 1442      Chemistry      Component Value Date/Time   NA 139 02/26/2013 1510   K  3.7 02/26/2013 1510   CL 101 02/26/2013 1510   CO2 32 02/26/2013 1510   BUN 15 02/26/2013 1510   CREATININE 0.81 02/26/2013 1510      Component Value Date/Time   CALCIUM 9.4 02/26/2013 1510   ALKPHOS 58 02/26/2013 1510   AST 20 02/26/2013 1510   ALT 15 02/26/2013 1510   BILITOT 0.9 02/26/2013 1510      Results for JAMESA, TEDRICK (MRN [id number]) as of 10/16/2014 09:01  Ref. Range 02/26/2013 15:10 03/05/2013 10:02 03/12/2013 08:33 03/19/2013 08:37 03/26/2013 15:49 04/02/2013 15:44 10/17/2013 14:42 03/02/2014 14:33 04/17/2014 15:48 10/15/2014 15:45  WBC Latest Range: 4.0-10.5 K/uL 3.6 (L) 3.6 (L) 3.3 (L) 3.3 (L) 3.7 (L) 3.5 (L) 3.4 (L)  4.1 3.9 (L)    Results for ELLORIE, KINDALL (MRN [id number]) as of 10/16/2014 09:01  Ref. Range 02/26/2013 15:10 03/05/2013 10:02 03/12/2013 08:33 03/19/2013 08:37 03/26/2013 15:49 04/02/2013 15:44 10/17/2013 14:42  NEUT# Latest Range: 1.7-7.7 K/uL 1.7 1.4 (L) 1.1 (L) 1.1 (L) 1.4 (L) 1.5 (L) 1.4 (L)     ASSESSMENT AND PLAN:  Chronic leukopenia Leukopenia with neutropenia has been stable x 2 + years.  She denies any infections and B symptoms.  This is benign in origin without change.  Therefore, we will release the patient from the [facility] and have her continue follow-up with her primary care provider.  We would be glad to see her back in the future if needed.      THERAPY PLAN:  Release from [facility].  Would be glad to see the patient back if re-referred from primary care provider or other provider.  All questions were answered. The patient knows to call the clinic with any problems, questions or concerns. We can certainly see the patient much sooner if necessary.  Patient and plan discussed with Dr. Loma Messing and she is in agreement with the aforementioned.   This note is electronically signed by: Dellis Anes 10/16/2014 3:49 PM

## 2014-10-16 NOTE — Assessment & Plan Note (Signed)
Leukopenia with neutropenia has been stable x 2 + years.  She denies any infections and B symptoms.  This is benign in origin without change.  Therefore, we will release the patient from the [facility] and have her continue follow-up with her primary care provider.  We would be glad to see her back in the future if needed.

## 2014-10-16 NOTE — Patient Instructions (Signed)
[facility] Discharge Instructions  RECOMMENDATIONS MADE BY THE CONSULTANT AND ANY TEST RESULTS WILL BE SENT TO YOUR REFERRING PHYSICIAN.  Discussion by Dellis Aneshomas Kefalas, PA-C We will release you from the clinic Notify your primary care physician if you have unexplained weight loss, night sweats, recurring infections, etc.  Thank you for choosing [facility] to provide your oncology and hematology care.  To afford each patient quality time with our provider, please arrive at least 15 minutes before your scheduled appointment time.    You need to re-schedule your appointment should you arrive 10 or more minutes late.  We strive to give you quality time with our providers, and arriving late affects you and other patients whose appointments are after yours.  Also, if you no show three or more times for appointments you may be dismissed from the clinic at the providers discretion.     Again, thank you for choosing [facility].  Our hope is that these requests will decrease the amount of time that you wait before being seen by our physicians.       _____________________________________________________________  Should you have questions after your visit to [facility], please contact our office at [phone] between the hours of 8:30 a.m. and 4:30 p.m.  Voicemails left after 4:30 p.m. will not be returned until the following business day.  For prescription refill requests, have your pharmacy contact our office.

## 2015-01-19 ENCOUNTER — Other Ambulatory Visit (HOSPITAL_COMMUNITY): Payer: Self-pay | Admitting: Internal Medicine

## 2015-01-19 DIAGNOSIS — Z1231 Encounter for screening mammogram for malignant neoplasm of breast: Secondary | ICD-10-CM

## 2015-03-08 ENCOUNTER — Ambulatory Visit (HOSPITAL_COMMUNITY)
Admission: RE | Admit: 2015-03-08 | Discharge: 2015-03-08 | Disposition: A | Discharge status: Home / Self Care | Payer: BC Managed Care – PPO | Source: Ambulatory Visit | Attending: Internal Medicine | Admitting: Internal Medicine

## 2015-03-08 DIAGNOSIS — Z1231 Encounter for screening mammogram for malignant neoplasm of breast: Secondary | ICD-10-CM | POA: Insufficient documentation

## 2015-08-11 ENCOUNTER — Encounter (INDEPENDENT_AMBULATORY_CARE_PROVIDER_SITE_OTHER): Payer: Self-pay | Admitting: *Deleted

## 2015-09-09 ENCOUNTER — Ambulatory Visit (INDEPENDENT_AMBULATORY_CARE_PROVIDER_SITE_OTHER): Payer: BC Managed Care – PPO | Admitting: Internal Medicine

## 2015-09-09 ENCOUNTER — Encounter (INDEPENDENT_AMBULATORY_CARE_PROVIDER_SITE_OTHER): Payer: Self-pay | Admitting: *Deleted

## 2015-09-09 ENCOUNTER — Encounter (INDEPENDENT_AMBULATORY_CARE_PROVIDER_SITE_OTHER): Payer: Self-pay | Admitting: Internal Medicine

## 2015-09-09 ENCOUNTER — Other Ambulatory Visit: Payer: Self-pay | Admitting: Obstetrics & Gynecology

## 2015-09-09 VITALS — BP 108/76 | HR 84 | Temp 98.0°F | Ht 63.0 in | Wt 158.8 lb

## 2015-09-09 DIAGNOSIS — R1319 Other dysphagia: Secondary | ICD-10-CM | POA: Diagnosis not present

## 2015-09-09 DIAGNOSIS — K219 Gastro-esophageal reflux disease without esophagitis: Secondary | ICD-10-CM | POA: Diagnosis not present

## 2015-09-09 NOTE — Progress Notes (Signed)
Subjective:    Patient ID: Stephanie Valencia, female    DOB: 03-Feb-1961, 55 y.o.   MRN: [id number]  HPI Referred by Anne Shutter MD for acid reflux [facility]).    She says it feels like it is cotton in her esophagus. Symptoms for over a year.  She says it feels like something is in her esophagus.  She occasionally has some dysphagia. She sometimes has trouble with pills and potatoes. Feels like these are slow to go down. She does not have any trouble with meats. Starchy foods bother her. There is no abdominal pain. She has been taking the Protonix as needed. She has not taken the Protonix in over 2 weeks. She has not had any harsh symptoms recently. Her appetite is good. No unintentional weight loss.  She has seasonal allergies. She has a BM daily or every other day. No melena or BRRB.  She occasionally has liquid stools, but they return to normal. Her last colonoscopy was in 2009 by Dr. Lovell Sheehan and was normal.    Review of Systems Past Medical History  Diagnosis Date  . Meningitis 1962    cut down for IV's  . Asthma     uses inhaler about twice a year  . Neutropenia (HCC)   . Neutropenia, unspecified (HCC) 04/11/2013  . Chronic leukopenia 04/11/2013    Past Surgical History  Procedure Laterality Date  . Tonsillectomy  1962  . Cesarean section  1982  . Thyroidectomy, partial Left 1998  . Colonoscopy  2009  . Cholecystectomy  2007    Allergies  Allergen Reactions  . Bactrim [Sulfamethoxazole-Trimethoprim] Rash    Head to toe body rash    Current Outpatient Prescriptions on File Prior to Visit  Medication Sig Dispense Refill  . acetaminophen (TYLENOL) 500 MG tablet Take 500 mg by mouth every 6 (six) hours as needed for pain. Takes 2 as needed for headaches.  Does not take tylenol when taking ibuprofen.    Marland Kitchen albuterol (PROVENTIL HFA;VENTOLIN HFA) 108 (90 BASE) MCG/ACT inhaler Inhale 2 puffs into the lungs as needed for wheezing.    . calcium carbonate (OS-CAL) 600  MG TABS Take 600 mg by mouth daily with breakfast. Reported on 09/09/2015    . Chlorpheniramine Maleate (EQ CHLORTABS PO) Take 1 tablet by mouth as needed. Takes as needed for sinus    . cholecalciferol (VITAMIN D) 400 UNITS TABS Take 2,000 Units by mouth daily.     Marland Kitchen ibuprofen (ADVIL,MOTRIN) 200 MG tablet Take 200 mg by mouth. Takes 3 tablets for headaches when needed  (does not take tylenol when taking Ibuprofen.    . levothyroxine (SYNTHROID, LEVOTHROID) 25 MCG tablet Take 25 mcg by mouth daily before breakfast.    . Lysine 500 MG TABS Take 1 tablet by mouth daily.     . pramipexole (MIRAPEX) 0.25 MG tablet Take 0.25 mg by mouth. Takes 1 and 1/2 tablets daily at bedtime     No current facility-administered medications on file prior to visit.        Objective:   Physical Exam Blood pressure 108/76, pulse 84, temperature 98 F (36.7 C), height  (1.6 m), weight 158 lb 12.8 oz (72.031 kg). Alert and oriented. Skin warm and dry. Oral mucosa is moist.   . Sclera anicteric, conjunctivae is pink. Thyroid not enlarged. No cervical lymphadenopathy. Lungs clear. Heart regular rate and rhythm.  Abdomen is soft. Bowel sounds are positive. No hepatomegaly. No abdominal masses felt. No tenderness.  No edema to lower extremities.          Assessment & Plan:  GERD. Dysphagia. Stricture needs to be ruled out. DG Esophagram. Further recommendations to follow.

## 2015-09-09 NOTE — Patient Instructions (Addendum)
DG esophagram.  Take Protonix 30 minutes before breakfast.

## 2015-09-10 ENCOUNTER — Encounter (INDEPENDENT_AMBULATORY_CARE_PROVIDER_SITE_OTHER): Payer: Self-pay

## 2015-09-15 ENCOUNTER — Ambulatory Visit (HOSPITAL_COMMUNITY)
Admission: RE | Admit: 2015-09-15 | Discharge: 2015-09-15 | Disposition: A | Discharge status: Home / Self Care | Payer: BC Managed Care – PPO | Source: Ambulatory Visit | Attending: Internal Medicine | Admitting: Internal Medicine

## 2015-09-15 DIAGNOSIS — R1319 Other dysphagia: Secondary | ICD-10-CM

## 2015-09-15 DIAGNOSIS — K224 Dyskinesia of esophagus: Secondary | ICD-10-CM | POA: Diagnosis not present

## 2015-09-15 DIAGNOSIS — R1310 Dysphagia, unspecified: Secondary | ICD-10-CM | POA: Diagnosis present

## 2015-09-22 ENCOUNTER — Encounter (INDEPENDENT_AMBULATORY_CARE_PROVIDER_SITE_OTHER): Payer: Self-pay | Admitting: *Deleted

## 2015-10-25 ENCOUNTER — Telehealth (INDEPENDENT_AMBULATORY_CARE_PROVIDER_SITE_OTHER): Payer: Self-pay | Admitting: *Deleted

## 2015-10-25 NOTE — Telephone Encounter (Signed)
Needs rx sent to her pharmacy--she did not leave a pharmacy

## 2015-10-26 NOTE — Telephone Encounter (Signed)
Message left on answering machine 

## 2015-10-27 NOTE — Telephone Encounter (Signed)
Ms. Stephanie Valencia left a message asking that you call her on phone number: [phone] in regards to needing a Rx.   Thank you.

## 2015-10-27 NOTE — Telephone Encounter (Signed)
Message left on answering machine 

## 2015-10-28 ENCOUNTER — Telehealth (INDEPENDENT_AMBULATORY_CARE_PROVIDER_SITE_OTHER): Payer: Self-pay | Admitting: Internal Medicine

## 2015-10-28 MED ORDER — PANTOPRAZOLE SODIUM 40 MG PO TBEC: 40.0000 mg | DELAYED_RELEASE_TABLET | Freq: Every day | ORAL | Status: DC

## 2015-10-28 NOTE — Telephone Encounter (Signed)
Rx for Protonix sent to her pharmacy. 

## 2015-10-28 NOTE — Telephone Encounter (Signed)
Rx for Protonix sent to her pharmacy. She has OV in May. Will re-evaluate and decide if she needs to be dilated.

## 2015-12-20 ENCOUNTER — Ambulatory Visit (INDEPENDENT_AMBULATORY_CARE_PROVIDER_SITE_OTHER): Payer: BC Managed Care – PPO | Admitting: Internal Medicine

## 2015-12-20 ENCOUNTER — Encounter (INDEPENDENT_AMBULATORY_CARE_PROVIDER_SITE_OTHER): Payer: Self-pay | Admitting: Internal Medicine

## 2015-12-20 VITALS — BP 122/70 | HR 68 | Temp 98.3°F | Ht 63.0 in | Wt 159.9 lb

## 2015-12-20 DIAGNOSIS — R1310 Dysphagia, unspecified: Secondary | ICD-10-CM | POA: Diagnosis not present

## 2015-12-20 DIAGNOSIS — K219 Gastro-esophageal reflux disease without esophagitis: Secondary | ICD-10-CM

## 2015-12-20 MED ORDER — PANTOPRAZOLE SODIUM 40 MG PO TBEC
40.0000 mg | DELAYED_RELEASE_TABLET | Freq: Every day | ORAL | Status: DC
Start: 2015-12-20 — End: 2016-12-19

## 2015-12-20 NOTE — Patient Instructions (Signed)
Continue the Protonix. OV in 1 year.  

## 2015-12-20 NOTE — Progress Notes (Addendum)
Subjective:    Patient ID: Stephanie Valencia, female    DOB: 1961-06-27, 55 y.o.   MRN: [id number]  HPI Here today for f/u. She was last seen in February for acid reflux. She c/o dysphagia and GERD. She had been taking Protonix as needed and had actually stopped taking 2 weeks prior to coming to our office. Her appetite had been good. No weight loss.  Hx of seasonal allergies. Her last colonoscopy was in 2009 by Dr. Lovell SheehanJenkins and was normal.   She tells me she is doing better. She says she is hardly having any problems. She occasionally takes Tums and Gas-X at night. For the most part her acid reflux is controlled. Takes Protonix daily. Has a BM daily. No melena or BRRB  09/15/2015 DG esophagram: Dysphagia:  IMPRESSION: Minimal laryngeal penetration without aspiration.  Mild esophageal dysmotility.  Otherwise normal exam.     Review of Systems Past Medical History  Diagnosis Date  . Meningitis 1962    cut down for IV's  . Asthma     uses inhaler about twice a year  . Neutropenia (HCC)   . Neutropenia, unspecified (HCC) 04/11/2013  . Chronic leukopenia 04/11/2013  . Hypothyroid     Past Surgical History  Procedure Laterality Date  . Tonsillectomy  1962  . Cesarean section  1982  . Thyroidectomy, partial Left 1998  . Colonoscopy  2009  . Cholecystectomy  2007    Allergies  Allergen Reactions  . Bactrim [Sulfamethoxazole-Trimethoprim] Rash    Head to toe body rash    Current Outpatient Prescriptions on File Prior to Visit  Medication Sig Dispense Refill  . acetaminophen (TYLENOL) 500 MG tablet Take 500 mg by mouth every 6 (six) hours as needed for pain. Takes 2 as needed for headaches.  Does not take tylenol when taking ibuprofen.    Marland Kitchen. albuterol (PROVENTIL HFA;VENTOLIN HFA) 108 (90 BASE) MCG/ACT inhaler Inhale 2 puffs into the lungs as needed for wheezing.    Marland Kitchen. atorvastatin (LIPITOR) 10 MG tablet Take 10 mg by mouth daily.    . Chlorpheniramine Maleate (EQ CHLORTABS PO)  Take 1 tablet by mouth as needed. Takes as needed for sinus    . cholecalciferol (VITAMIN D) 400 UNITS TABS Take 2,000 Units by mouth daily.     . diphenhydramine-acetaminophen (TYLENOL PM) 25-500 MG TABS tablet Take 1 tablet by mouth at bedtime as needed.    . Glucosamine-Chondroit-Vit C-Mn (GLUCOSAMINE 1500 COMPLEX PO) Take 1,000 mg by mouth.    Marland Kitchen. ibuprofen (ADVIL,MOTRIN) 200 MG tablet Take 200 mg by mouth. Takes 3 tablets for headaches when needed  (does not take tylenol when taking Ibuprofen.    . levothyroxine (SYNTHROID, LEVOTHROID) 25 MCG tablet Take 25 mcg by mouth daily before breakfast.    . Lysine 500 MG TABS Take 1 tablet by mouth daily.     . pantoprazole (PROTONIX) 40 MG tablet Take 1 tablet (40 mg total) by mouth daily. 90 tablet 4  . pramipexole (MIRAPEX) 0.25 MG tablet Take 0.25 mg by mouth. Takes 1 and 1/2 tablets daily at bedtime    . simethicone (MYLICON) 80 MG chewable tablet Chew 80 mg by mouth every 6 (six) hours as needed for flatulence.     No current facility-administered medications on file prior to visit.        Objective:   Physical Exam Blood pressure 122/70, pulse 68, temperature 98.3 F (36.8 C), height 5\' 3"  (1.6 m), weight 159 lb 14.4 oz (72.53  kg). Alert and oriented. Skin warm and dry. Oral mucosa is moist.   . Sclera anicteric, conjunctivae is pink. Thyroid not enlarged. No cervical lymphadenopathy. Lungs clear. Heart regular rate and rhythm.  Abdomen is soft. Bowel sounds are positive. No hepatomegaly. No abdominal masses felt. No tenderness.  No edema to lower extremities.          Assessment & Plan:  GERD controlled with Protonix. No problems with dysphagia. OV in 1 year.

## 2016-02-15 ENCOUNTER — Other Ambulatory Visit (HOSPITAL_COMMUNITY): Payer: Self-pay | Admitting: Internal Medicine

## 2016-02-15 DIAGNOSIS — Z1231 Encounter for screening mammogram for malignant neoplasm of breast: Secondary | ICD-10-CM

## 2016-03-13 ENCOUNTER — Ambulatory Visit (HOSPITAL_COMMUNITY)
Admission: RE | Admit: 2016-03-13 | Discharge: 2016-03-13 | Disposition: A | Discharge status: Home / Self Care | Payer: BC Managed Care – PPO | Source: Ambulatory Visit | Attending: Internal Medicine | Admitting: Internal Medicine

## 2016-03-13 DIAGNOSIS — Z1231 Encounter for screening mammogram for malignant neoplasm of breast: Secondary | ICD-10-CM | POA: Insufficient documentation

## 2016-12-05 IMAGING — MG DIGITAL SCREENING BILATERAL MAMMOGRAM WITH CAD
4 series · 4 of 4 positions shown · non-contrast
Comparison: Previous exam(s).

CLINICAL DATA: Screening.

EXAM:
DIGITAL SCREENING BILATERAL MAMMOGRAM WITH CAD

[L MLO]
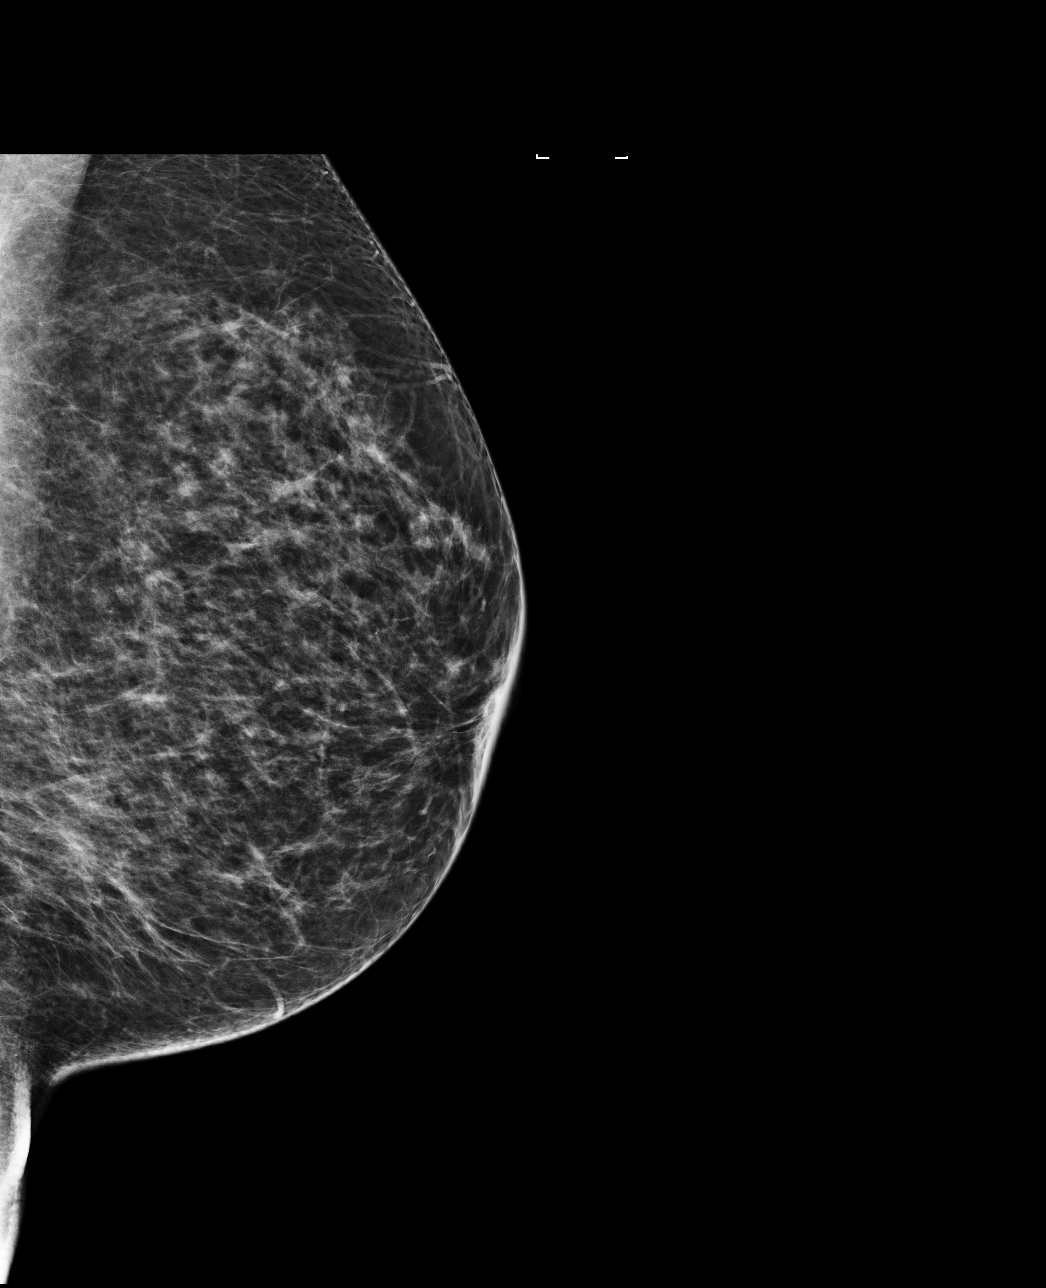

[R CC]
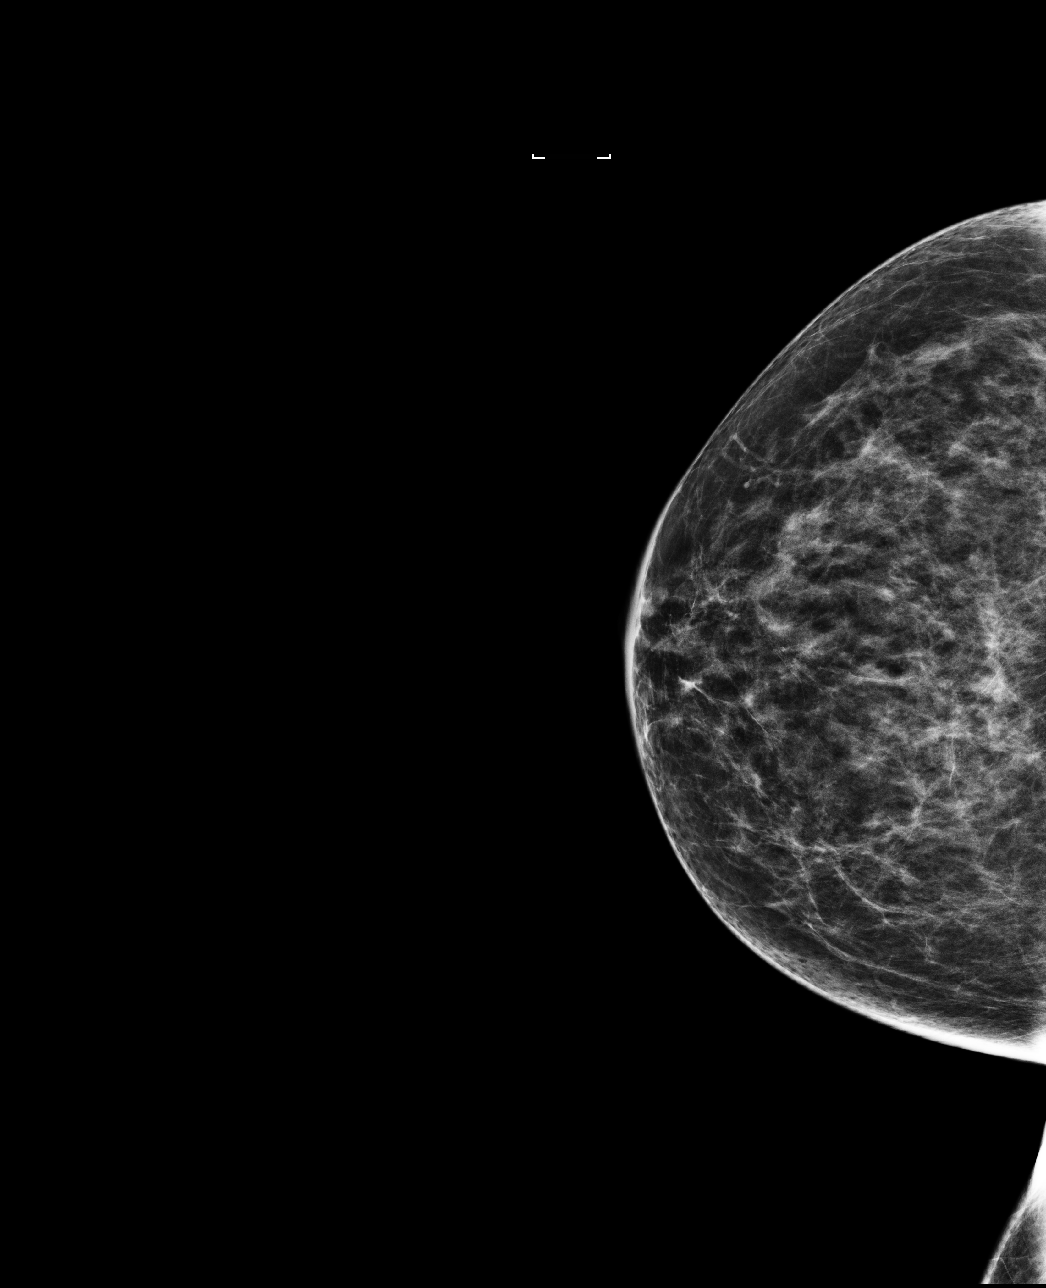

[R MLO]
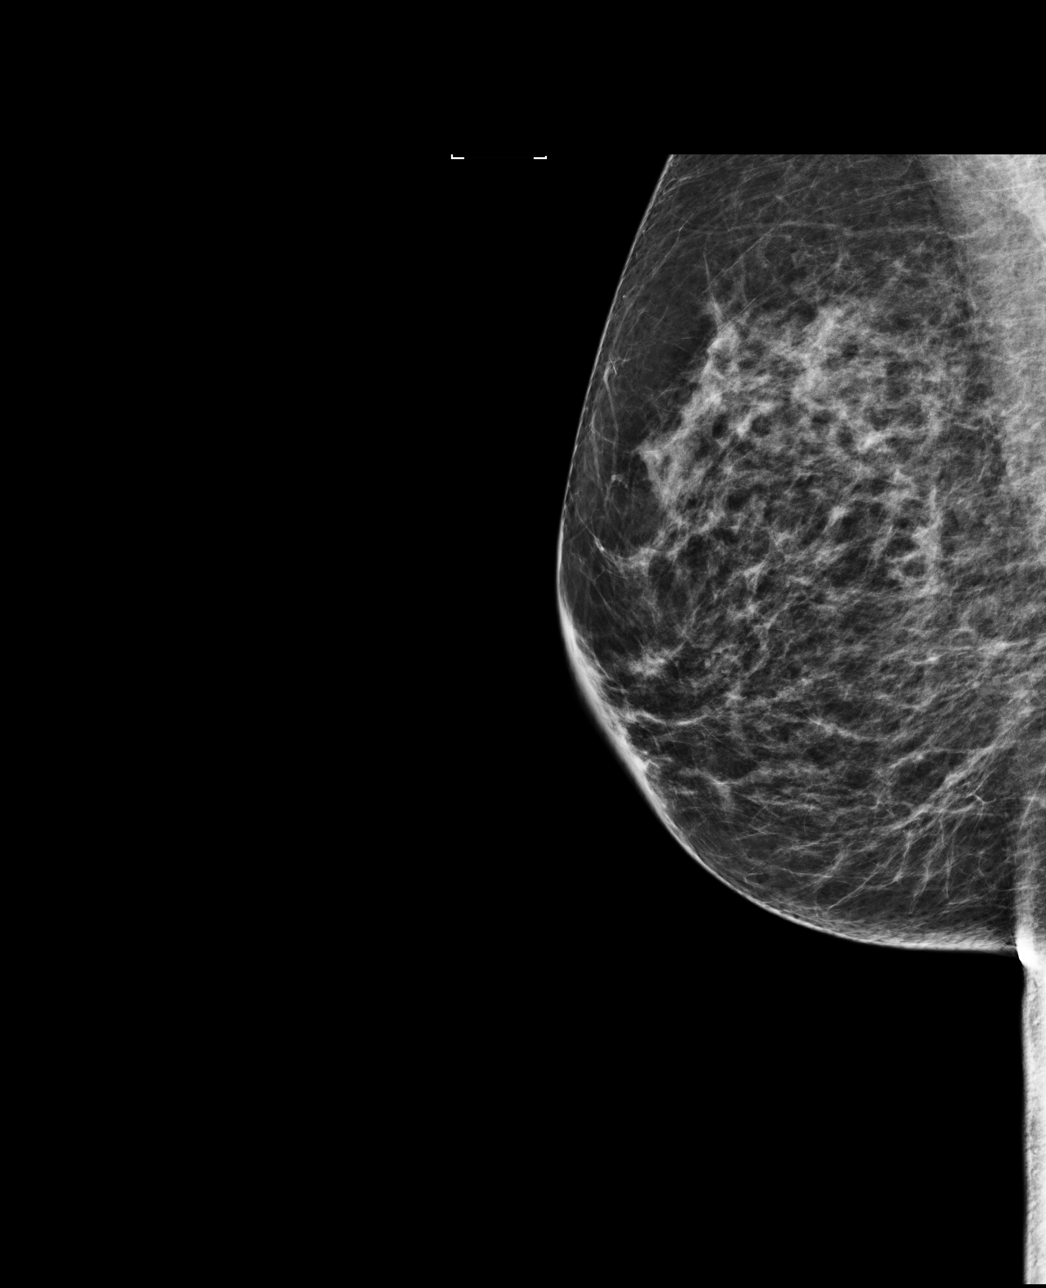

[L CC]
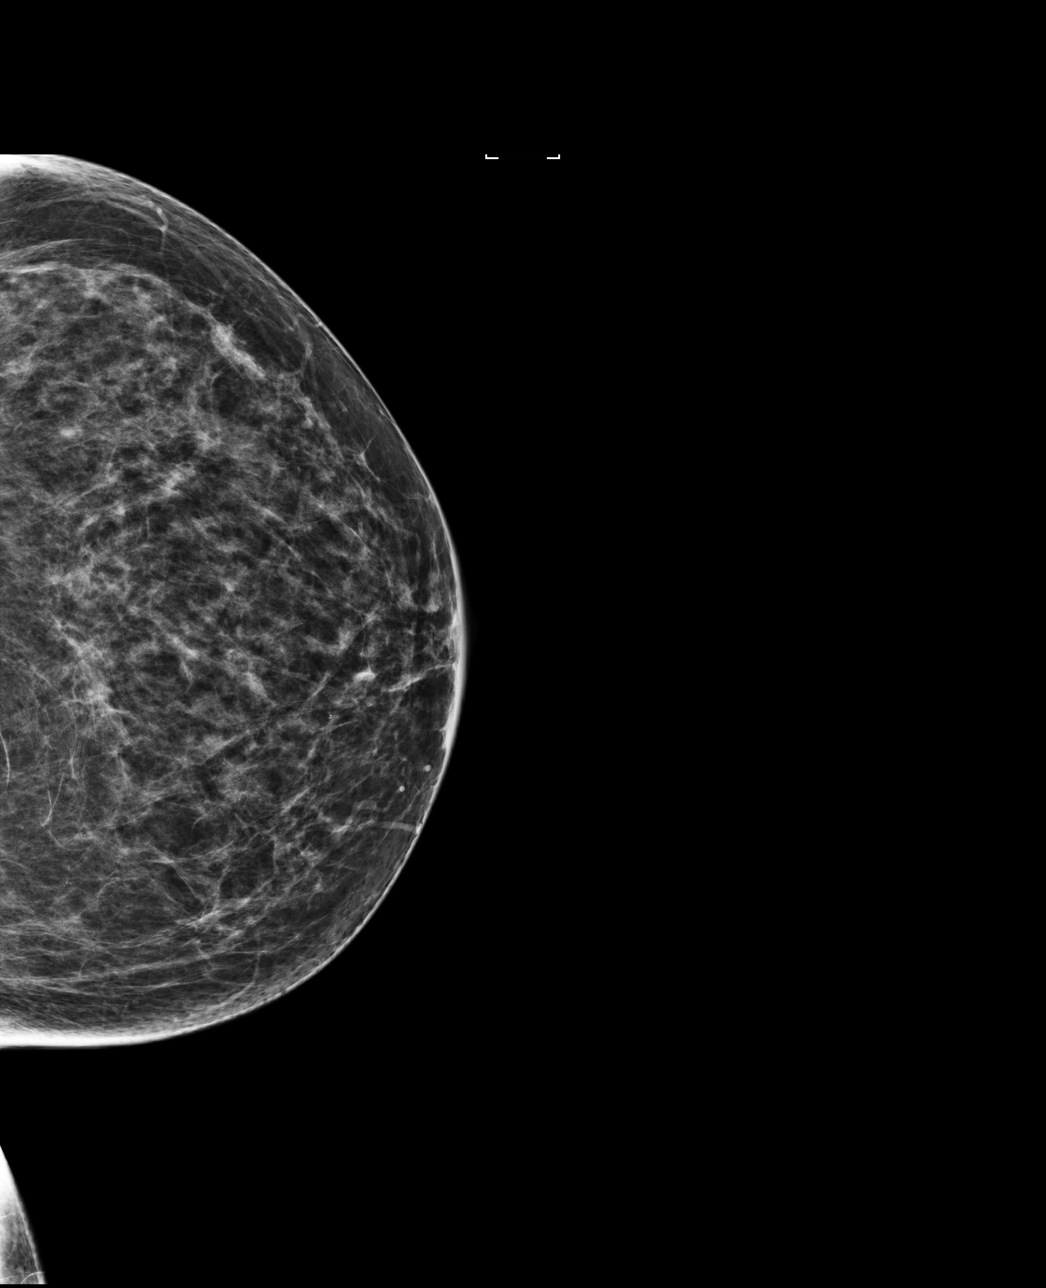

[4 of 4 positions shown; findings below may reference images not displayed]

ACR Breast Density Category c: The breast tissue is heterogeneously
dense, which may obscure small masses.
FINDINGS: There are no findings suspicious for malignancy. Images were
processed with CAD.
IMPRESSION: No mammographic evidence of malignancy. A result letter of this
screening mammogram will be mailed directly to the patient.

RECOMMENDATION:
Screening mammogram in one year. (Code:[id number])

BI-RADS CATEGORY  1: Negative.

## 2016-12-07 ENCOUNTER — Encounter (INDEPENDENT_AMBULATORY_CARE_PROVIDER_SITE_OTHER): Payer: Self-pay | Admitting: Internal Medicine

## 2016-12-19 ENCOUNTER — Encounter (INDEPENDENT_AMBULATORY_CARE_PROVIDER_SITE_OTHER): Payer: Self-pay | Admitting: Internal Medicine

## 2016-12-19 ENCOUNTER — Ambulatory Visit (INDEPENDENT_AMBULATORY_CARE_PROVIDER_SITE_OTHER): Payer: BC Managed Care – PPO | Admitting: Internal Medicine

## 2016-12-19 VITALS — BP 122/80 | HR 72 | Temp 98.2°F | Ht 63.5 in | Wt 159.0 lb

## 2016-12-19 DIAGNOSIS — K219 Gastro-esophageal reflux disease without esophagitis: Secondary | ICD-10-CM | POA: Diagnosis not present

## 2016-12-19 MED ORDER — PANTOPRAZOLE SODIUM 40 MG PO TBEC: 40.0000 mg | DELAYED_RELEASE_TABLET | Freq: Every day | ORAL | 5 refills | Status: DC

## 2016-12-19 NOTE — Patient Instructions (Signed)
OV in 1 year.  

## 2016-12-19 NOTE — Progress Notes (Signed)
Subjective:    Patient ID: Stephanie Valencia, female    DOB: [birth year], 56 y.o.   MRN: [id number]  HPI Here today for f/u. She was last seen in May of 2017. Hx of GERD and dysphagia. She tells me she feels pretty good overall. She takes Protonix daily for GERD. She has a little heaviness in her upper esophagus. Symptoms for a couple of weeks and then will resolved. For the most part her acid reflux os controlled.   Her appetite is good. No weight loss. She has a BM every 1-2 days. No melena or BRRB.   Her last colonoscopy was in 2009 by Dr. Lovell Sheehan and was normal.     09/15/2015 DG esophagram: Dysphagia:  IMPRESSION: Minimal laryngeal penetration without aspiration. Mild esophageal dysmotility. Otherwise normal exam.    Review of Systems Past Medical History:  Diagnosis Date  . Asthma    uses inhaler about twice a year  . Chronic leukopenia 04/11/2013  . Hypothyroid   . 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 Neutropenia (HCC)   . Neutropenia, unspecified (HCC) 04/11/2013    Past Surgical History:  Procedure Laterality Date  . CESAREAN SECTION  1982  . CHOLECYSTECTOMY  2007  . COLONOSCOPY  2009  . THYROIDECTOMY, PARTIAL Left 1998  . TONSILLECTOMY  1962    Allergies  Allergen Reactions  . Bactrim [Sulfamethoxazole-Trimethoprim] Rash    Head to toe body rash    Current Outpatient Prescriptions on File Prior to Visit  Medication Sig Dispense Refill  . acetaminophen (TYLENOL) 500 MG tablet Take 500 mg by mouth every 6 (six) hours as needed for pain. Takes 2 as needed for headaches.  Does not take tylenol when taking ibuprofen.    Marland Kitchen albuterol (PROVENTIL HFA;VENTOLIN HFA) 108 (90 BASE) MCG/ACT inhaler Inhale 2 puffs into the lungs as needed for wheezing.    Marland Kitchen atorvastatin (LIPITOR) 10 MG tablet Take 10 mg by mouth daily.    . Chlorpheniramine Maleate (EQ CHLORTABS PO) Take 1 tablet by mouth as needed. Takes as needed for sinus    . cholecalciferol (VITAMIN D) 400 UNITS TABS  Take 2,000 Units by mouth daily.     . diphenhydramine-acetaminophen (TYLENOL PM) 25-500 MG TABS tablet Take 1 tablet by mouth at bedtime as needed.    Marland Kitchen ibuprofen (ADVIL,MOTRIN) 200 MG tablet Take 200 mg by mouth. Takes 3 tablets for headaches when needed  (does not take tylenol when taking Ibuprofen.    . levothyroxine (SYNTHROID, LEVOTHROID) 25 MCG tablet Take 25 mcg by mouth daily before breakfast.    . Lysine 500 MG TABS Take 1 tablet by mouth daily.     . pantoprazole (PROTONIX) 40 MG tablet Take 1 tablet (40 mg total) by mouth daily. 90 tablet 4  . pramipexole (MIRAPEX) 0.25 MG tablet Take 0.25 mg by mouth. Takes 1 and 1/2 tablets daily at bedtime    . simethicone (MYLICON) 80 MG chewable tablet Chew 80 mg by mouth every 6 (six) hours as needed for flatulence.     No current facility-administered medications on file prior to visit.        Objective:   Physical Exam Blood pressure 122/80, pulse 72, temperature 98.2 F (36.8 C), height 5' 3.5" (1.613 m), weight 159 lb (72.1 kg). Alert and oriented. Skin warm and dry. Oral mucosa is moist.   . Sclera anicteric, conjunctivae is pink. Thyroid not enlarged. No cervical lymphadenopathy. Lungs clear. Heart regular rate and rhythm.  Abdomen is soft. Bowel sounds are positive. No hepatomegaly. No abdominal masses felt. No tenderness.  No edema to lower extremities.          Assessment & Plan:  GERD controlled with Protonix. OV in 1 year.

## 2017-02-28 ENCOUNTER — Other Ambulatory Visit (HOSPITAL_COMMUNITY): Payer: Self-pay | Admitting: Registered Nurse

## 2017-02-28 DIAGNOSIS — Z1231 Encounter for screening mammogram for malignant neoplasm of breast: Secondary | ICD-10-CM

## 2017-03-15 ENCOUNTER — Ambulatory Visit (HOSPITAL_COMMUNITY)
Admission: RE | Admit: 2017-03-15 | Discharge: 2017-03-15 | Disposition: A | Discharge status: Home / Self Care | Payer: BC Managed Care – PPO | Source: Ambulatory Visit | Attending: Registered Nurse | Admitting: Registered Nurse

## 2017-03-15 DIAGNOSIS — Z1231 Encounter for screening mammogram for malignant neoplasm of breast: Secondary | ICD-10-CM | POA: Diagnosis present

## 2017-12-18 ENCOUNTER — Encounter (INDEPENDENT_AMBULATORY_CARE_PROVIDER_SITE_OTHER): Payer: Self-pay | Admitting: Internal Medicine

## 2017-12-18 ENCOUNTER — Ambulatory Visit (INDEPENDENT_AMBULATORY_CARE_PROVIDER_SITE_OTHER): Payer: BC Managed Care – PPO | Admitting: Internal Medicine

## 2017-12-18 VITALS — BP 148/78 | HR 60 | Temp 98.5°F | Ht 63.5 in | Wt 170.5 lb

## 2017-12-18 DIAGNOSIS — K219 Gastro-esophageal reflux disease without esophagitis: Secondary | ICD-10-CM

## 2017-12-18 MED ORDER — PANTOPRAZOLE SODIUM 40 MG PO TBEC: 40.0000 mg | DELAYED_RELEASE_TABLET | Freq: Every day | ORAL | 3 refills | Status: DC

## 2017-12-18 NOTE — Progress Notes (Signed)
Subjective:    Patient ID: Stephanie Valencia, female    DOB: [birth year], 57 y.o.   MRN: [id number] Wt in May of 2018 159 HPI Here today for f/u. Last seen in May of 2018. Hx of chronic GERD and is maintained on Protonix. She tells me she is doing pretty good. She is taking the Protonix at night now and she says it is working better. Her appetite is good.  She has gained about 10 pounds since a year ago.  BMs are normal. No melena or BRRB   09/15/2015 DG esophagram: Dysphagia:  IMPRESSION: Minimal laryngeal penetration without aspiration. Mild esophageal dysmotility. Otherwise normal exam.  Her last colonoscopy was in 2009 by Dr. Lovell Sheehan and was normal.  Review of Systems Past Medical History:  Diagnosis Date  . Asthma    uses inhaler about twice a year  . Chronic leukopenia 04/11/2013  . Hypothyroid   . 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 Neutropenia (HCC)   . Neutropenia, unspecified (HCC) 04/11/2013    Past Surgical History:  Procedure Laterality Date  . CESAREAN SECTION  1982  . CHOLECYSTECTOMY  2007  . COLONOSCOPY  2009  . THYROIDECTOMY, PARTIAL Left 1998  . TONSILLECTOMY  1962    Allergies  Allergen Reactions  . Bactrim [Sulfamethoxazole-Trimethoprim] Rash    Head to toe body rash    Current Outpatient Medications on File Prior to Visit  Medication Sig Dispense Refill  . acetaminophen (TYLENOL) 500 MG tablet Take 500 mg by mouth every 6 (six) hours as needed for pain. Takes 2 as needed for headaches.  Does not take tylenol when taking ibuprofen.    Marland Kitchen albuterol (PROVENTIL HFA;VENTOLIN HFA) 108 (90 BASE) MCG/ACT inhaler Inhale 2 puffs into the lungs as needed for wheezing.    Marland Kitchen atorvastatin (LIPITOR) 10 MG tablet Take 10 mg by mouth daily.    . Chlorpheniramine Maleate (EQ CHLORTABS PO) Take 1 tablet by mouth as needed. Takes as needed for sinus    . cholecalciferol (VITAMIN D) 400 UNITS TABS Take 2,000 Units by mouth daily.     . diphenhydramine-acetaminophen  (TYLENOL PM) 25-500 MG TABS tablet Take 1 tablet by mouth at bedtime as needed.    Marland Kitchen ibuprofen (ADVIL,MOTRIN) 200 MG tablet Take 200 mg by mouth. Takes 3 tablets for headaches when needed  (does not take tylenol when taking Ibuprofen.    . levothyroxine (SYNTHROID, LEVOTHROID) 25 MCG tablet Take 25 mcg by mouth daily before breakfast.    . Lysine 500 MG TABS Take 1 tablet by mouth daily.     . pantoprazole (PROTONIX) 40 MG tablet Take 1 tablet (40 mg total) by mouth daily. 90 tablet 5  . pramipexole (MIRAPEX) 0.25 MG tablet Take 0.25 mg by mouth. Takes 1 and 1/2 tablets daily at bedtime    . simethicone (MYLICON) 80 MG chewable tablet Chew 80 mg by mouth every 6 (six) hours as needed for flatulence.     No current facility-administered medications on file prior to visit.         Objective:   Physical Exam Blood pressure (!) 148/78, pulse 60, temperature 98.5 F (36.9 C), height 5' 3.5" (1.613 m), weight 170 lb 8 oz (77.3 kg). Alert and oriented. Skin warm and dry. Oral mucosa is moist.   . Sclera anicteric, conjunctivae is pink. Thyroid not enlarged. No cervical lymphadenopathy. Lungs clear. Heart regular rate and rhythm.  Abdomen is soft. Bowel sounds are positive. No hepatomegaly. No  abdominal masses felt. No tenderness.  No edema to lower extremities.           Assessment & Plan:  GERD. She is doing well. She will continue the Protonix.

## 2017-12-18 NOTE — Patient Instructions (Signed)
Continue the Protonix. OV in 1 year.  

## 2018-01-21 ENCOUNTER — Encounter (INDEPENDENT_AMBULATORY_CARE_PROVIDER_SITE_OTHER): Payer: Self-pay | Admitting: Internal Medicine

## 2018-01-21 ENCOUNTER — Ambulatory Visit (INDEPENDENT_AMBULATORY_CARE_PROVIDER_SITE_OTHER): Payer: BC Managed Care – PPO | Admitting: Internal Medicine

## 2018-01-21 ENCOUNTER — Telehealth (INDEPENDENT_AMBULATORY_CARE_PROVIDER_SITE_OTHER): Payer: Self-pay | Admitting: Internal Medicine

## 2018-01-21 VITALS — BP 124/82 | HR 72 | Temp 98.3°F | Ht 63.0 in | Wt 164.4 lb

## 2018-01-21 DIAGNOSIS — K921 Melena: Secondary | ICD-10-CM

## 2018-01-21 DIAGNOSIS — Z1211 Encounter for screening for malignant neoplasm of colon: Secondary | ICD-10-CM

## 2018-01-21 NOTE — Progress Notes (Addendum)
Subjective:    Patient ID: Stephanie Valencia, female    DOB: June 25, 1961, 57 y.o.   MRN: [id number]    HPI PCP Ileana RoupBenjamin PA-C.  Presents today with c/o rectal bleeding.  She says she [facility] some rectal [facility]. She says she noticed the stools had black mixed in them, The stool was 3/4 black.  but were not completely black.  She had one black stool and one was a little bit lighter. She has a small amt of acid reflux. She says for the most part, Protonix controls her symptoms.  Her stools are brown now.  She does not take Motrin very often . No family hx of colon cancer.  She denies constipation. She has a BM every day or maybe every 3 days.    Her last colonoscopy was in 2009 and was normal.  Hx of chronic GERD and maintained of Protonix.  Has lost 5 pounds since OV in May.   01/11/2018 H and H 13.6 and 41.2    09/15/2015 DG esophagram: Dysphagia:  IMPRESSION: Minimal laryngeal penetration without aspiration. Mild esophageal dysmotility. Otherwise normal exam.  Her last colonoscopy was in 2009 by Dr. Lovell SheehanJenkins and was normal.   Review of Systems Past Medical History:  Diagnosis Date  . Asthma    uses inhaler about twice a year  . Chronic leukopenia 04/11/2013  . Hypothyroid   . 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 Neutropenia (HCC)   . Neutropenia, unspecified (HCC) 04/11/2013    Past Surgical History:  Procedure Laterality Date  . CESAREAN SECTION  1982  . CHOLECYSTECTOMY  2007  . COLONOSCOPY  2009  . THYROIDECTOMY, PARTIAL Left 1998  . TONSILLECTOMY  1962    Allergies  Allergen Reactions  . Bactrim [Sulfamethoxazole-Trimethoprim] Rash    Head to toe body rash  . 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    Current Outpatient Medications on File Prior to Visit  Medication Sig Dispense Refill  . acetaminophen (TYLENOL) 500 MG tablet Take 500 mg by mouth every 6 (six) hours as needed for pain. Takes 2 as needed for headaches.  Does  not take tylenol when taking ibuprofen.    Marland Kitchen. albuterol (PROVENTIL HFA;VENTOLIN HFA) 108 (90 BASE) MCG/ACT inhaler Inhale 2 puffs into the lungs as needed for wheezing.    Marland Kitchen. atorvastatin (LIPITOR) 10 MG tablet Take 10 mg by mouth daily.    . Chlorpheniramine Maleate (EQ CHLORTABS PO) Take 1 tablet by mouth as needed. Takes as needed for sinus    . cholecalciferol (VITAMIN D) 400 UNITS TABS Take 2,000 Units by mouth daily.     . diphenhydramine-acetaminophen (TYLENOL PM) 25-500 MG TABS tablet Take 1 tablet by mouth at bedtime as needed.    Marland Kitchen. ibuprofen (ADVIL,MOTRIN) 200 MG tablet Take 200 mg by mouth. Takes 3 tablets for headaches when needed  (does not take tylenol when taking Ibuprofen.    . levothyroxine (SYNTHROID, LEVOTHROID) 25 MCG tablet Take 25 mcg by mouth daily before breakfast.    . Lysine 500 MG TABS Take 1 tablet by mouth daily.     . pantoprazole (PROTONIX) 40 MG tablet Take 1 tablet (40 mg total) by mouth daily. 90 tablet 3  . pramipexole (MIRAPEX) 0.25 MG tablet Take 0.25 mg by mouth. Takes 1 and 1/2 tablets daily at bedtime    . simethicone (MYLICON) 80 MG chewable tablet Chew 80 mg by mouth every 6 (six) hours  as needed for flatulence.     No current facility-administered medications on file prior to visit.         Objective:   Physical Exam Blood pressure 124/82, pulse 72, temperature 98.3 F (36.8 C), height 5\' 3"  (1.6 m), weight 164 lb 6.4 oz (74.6 kg). Alert and oriented. Skin warm and dry. Oral mucosa is moist.   . Sclera anicteric, conjunctivae is pink. Thyroid not enlarged. No cervical lymphadenopathy. Lungs clear. Heart regular rate and rhythm.  Abdomen is soft. Bowel sounds are positive. No hepatomegaly. No abdominal masses felt. No tenderness.  No edema to lower extremities.           Assessment & Plan:  Rectal bleeding. ? Melena resolved. ? EGD. She will pick up stool card. If CBC is normal and stool card is negative, will proceed with just a colonoscopy. Her  last colonoscopy in 2009 and was normal.  She is due for a colonoscopy.   Marland Kitchen

## 2018-01-21 NOTE — Patient Instructions (Signed)
I will discuss with Dr. Rehman. 

## 2018-01-21 NOTE — Telephone Encounter (Signed)
Need a CBC and hemocult card test. If negative, she will just need an EGD.

## 2018-01-21 NOTE — Progress Notes (Signed)
Patient ID: Stephanie Valencia, female   DOB: [birth year], 57 y.o.   MRN: [id number] P

## 2018-01-28 ENCOUNTER — Telehealth (INDEPENDENT_AMBULATORY_CARE_PROVIDER_SITE_OTHER): Payer: Self-pay | Admitting: Internal Medicine

## 2018-01-28 ENCOUNTER — Other Ambulatory Visit (INDEPENDENT_AMBULATORY_CARE_PROVIDER_SITE_OTHER): Payer: Self-pay | Admitting: Internal Medicine

## 2018-01-28 DIAGNOSIS — Z1211 Encounter for screening for malignant neoplasm of colon: Secondary | ICD-10-CM

## 2018-01-28 DIAGNOSIS — K921 Melena: Secondary | ICD-10-CM

## 2018-01-28 NOTE — Telephone Encounter (Signed)
err

## 2018-01-28 NOTE — Telephone Encounter (Signed)
Left message for patient to give me a call to aschedule

## 2018-01-28 NOTE — Telephone Encounter (Signed)
01/11/2018 H and H 13.6 and 41.2 Hemoccult negative. Will only need a colonoscopy.

## 2018-01-28 NOTE — Telephone Encounter (Signed)
Ann, screening colonoscopy.  I have spoken with patient

## 2018-01-28 NOTE — Telephone Encounter (Signed)
Hemoccult was negative for blood. This was checked on 01/25/2018.

## 2018-01-28 NOTE — Telephone Encounter (Signed)
H and H ordered 

## 2018-01-28 NOTE — Telephone Encounter (Signed)
CBC and hemocult  Card. If hemoglobin stable and hemocult negative, just a colonoscopy

## 2018-01-29 ENCOUNTER — Telehealth (INDEPENDENT_AMBULATORY_CARE_PROVIDER_SITE_OTHER): Payer: Self-pay | Admitting: *Deleted

## 2018-01-29 ENCOUNTER — Encounter (INDEPENDENT_AMBULATORY_CARE_PROVIDER_SITE_OTHER): Payer: Self-pay | Admitting: *Deleted

## 2018-01-29 DIAGNOSIS — Z1211 Encounter for screening for malignant neoplasm of colon: Secondary | ICD-10-CM | POA: Insufficient documentation

## 2018-01-29 MED ORDER — PEG 3350-KCL-NA BICARB-NACL 420 G PO SOLR: 4000.0000 mL | Freq: Once | ORAL | 0 refills | Status: AC

## 2018-01-29 NOTE — Telephone Encounter (Signed)
TCS sch'd 03/04/18, patient aware

## 2018-01-29 NOTE — Telephone Encounter (Signed)
Patient needs trilyte 

## 2018-02-18 ENCOUNTER — Other Ambulatory Visit (HOSPITAL_COMMUNITY): Payer: Self-pay | Admitting: Family Medicine

## 2018-02-18 DIAGNOSIS — Z1231 Encounter for screening mammogram for malignant neoplasm of breast: Secondary | ICD-10-CM

## 2018-03-04 ENCOUNTER — Encounter (HOSPITAL_COMMUNITY): Payer: Self-pay

## 2018-03-04 ENCOUNTER — Encounter (HOSPITAL_COMMUNITY)
Admission: RE | Disposition: A | Discharge status: Home / Self Care | Payer: Self-pay | Source: Ambulatory Visit | Attending: Internal Medicine

## 2018-03-04 ENCOUNTER — Ambulatory Visit (HOSPITAL_COMMUNITY)
Admission: RE | Admit: 2018-03-04 | Discharge: 2018-03-04 | Disposition: A | Discharge status: Home / Self Care | Payer: BC Managed Care – PPO | Source: Ambulatory Visit | Attending: Internal Medicine | Admitting: Internal Medicine

## 2018-03-04 ENCOUNTER — Other Ambulatory Visit: Payer: Self-pay

## 2018-03-04 DIAGNOSIS — E039 Hypothyroidism, unspecified: Secondary | ICD-10-CM | POA: Diagnosis not present

## 2018-03-04 DIAGNOSIS — Z8661 Personal history of infections of the central nervous system: Secondary | ICD-10-CM | POA: Insufficient documentation

## 2018-03-04 DIAGNOSIS — Z79899 Other long term (current) drug therapy: Secondary | ICD-10-CM | POA: Diagnosis not present

## 2018-03-04 DIAGNOSIS — J45909 Unspecified asthma, uncomplicated: Secondary | ICD-10-CM | POA: Insufficient documentation

## 2018-03-04 DIAGNOSIS — Z1211 Encounter for screening for malignant neoplasm of colon: Secondary | ICD-10-CM | POA: Insufficient documentation

## 2018-03-04 DIAGNOSIS — Z882 Allergy status to sulfonamides status: Secondary | ICD-10-CM | POA: Diagnosis not present

## 2018-03-04 DIAGNOSIS — K644 Residual hemorrhoidal skin tags: Secondary | ICD-10-CM | POA: Insufficient documentation

## 2018-03-04 HISTORY — PX: COLONOSCOPY: SHX5424

## 2018-03-04 SURGERY — COLONOSCOPY
Anesthesia: Moderate Sedation

## 2018-03-04 MED ORDER — MEPERIDINE HCL 50 MG/ML IJ SOLN
INTRAMUSCULAR | Status: AC
  Filled 2018-03-04: qty 1

## 2018-03-04 MED ORDER — MIDAZOLAM HCL 5 MG/5ML IJ SOLN
INTRAMUSCULAR | Status: DC | PRN
  Administered 2018-03-04: 2 mg via INTRAVENOUS
  Administered 2018-03-04: 1 mg via INTRAVENOUS

## 2018-03-04 MED ORDER — SODIUM CHLORIDE 0.9 % IV SOLN
INTRAVENOUS | Status: DC
  Administered 2018-03-04: 10:00:00 via INTRAVENOUS

## 2018-03-04 MED ORDER — MEPERIDINE HCL 50 MG/ML IJ SOLN
INTRAMUSCULAR | Status: DC | PRN
  Administered 2018-03-04 (×2): 25 mg

## 2018-03-04 MED ORDER — SIMETHICONE 40 MG/0.6ML PO SUSP
ORAL | Status: DC | PRN
  Administered 2018-03-04: 10:00:00

## 2018-03-04 MED ORDER — MIDAZOLAM HCL 5 MG/5ML IJ SOLN
INTRAMUSCULAR | Status: AC
  Filled 2018-03-04: qty 10

## 2018-03-04 NOTE — Discharge Instructions (Signed)
Resume usual medications and diet as before. °No driving for 24 hours. °Next screening exam in 10 years. ° ° ° ° ° ° °Colonoscopy, Adult, Care After °This sheet gives you information about how to care for yourself after your procedure. Your doctor may also give you more specific instructions. If you have problems or questions, call your doctor. °Follow these instructions at home: °General instructions ° °· For the first 24 hours after the procedure: °? Do not drive or use machinery. °? Do not sign important documents. °? Do not drink alcohol. °? Do your daily activities more slowly than normal. °? Eat foods that are soft and easy to digest. °? Rest often. °· Take over-the-counter or prescription medicines only as told by your doctor. °· It is up to you to get the results of your procedure. Ask your doctor, or the department performing the procedure, when your results will be ready. °To help cramping and bloating: °· Try walking around. °· Put heat on your belly (abdomen) as told by your doctor. Use a heat source that your doctor recommends, such as a moist heat pack or a heating pad. °? Put a towel between your skin and the heat source. °? Leave the heat on for 20-30 minutes. °? Remove the heat if your skin turns bright red. This is especially important if you cannot feel pain, heat, or cold. You can get burned. °Eating and drinking °· Drink enough fluid to keep your pee (urine) clear or pale yellow. °· Return to your normal diet as told by your doctor. Avoid heavy or fried foods that are hard to digest. °· Avoid drinking alcohol for as long as told by your doctor. °Contact a doctor if: °· You have blood in your poop (stool) 2-3 days after the procedure. °Get help right away if: °· You have more than a small amount of blood in your poop. °· You see large clumps of tissue (blood clots) in your poop. °· Your belly is swollen. °· You feel sick to your stomach (nauseous). °· You throw up (vomit). °· You have a  fever. °· You have belly pain that gets worse, and medicine does not help your pain. °This information is not intended to replace advice given to you by your health care provider. Make sure you discuss any questions you have with your health care provider. °Document Released: 08/26/2010 Document Revised: 04/17/2016 Document Reviewed: 04/17/2016 °Elsevier Interactive Patient Education © 2017 [employer] ° ° ° ° °Hemorrhoids °Hemorrhoids are swollen veins in and around the rectum or anus. There are two types of hemorrhoids: °· Internal hemorrhoids. These occur in the veins that are just inside the rectum. They may poke through to the outside and become irritated and painful. °· External hemorrhoids. These occur in the veins that are outside of the anus and can be felt as a painful swelling or hard lump near the anus. ° °Most hemorrhoids do not cause serious problems, and they can be managed with home treatments such as diet and lifestyle changes. If home treatments do not help your symptoms, procedures can be done to shrink or remove the hemorrhoids. °What are the causes? °This condition is caused by increased pressure in the anal area. This pressure may result from various things, including: °· Constipation. °· Straining to have a bowel movement. °· Diarrhea. °· Pregnancy. °· Obesity. °· Sitting for long periods of time. °· Heavy lifting or other activity that causes you to strain. °· Anal sex. ° °What are   the signs or symptoms? °Symptoms of this condition include: °· Pain. °· Anal itching or irritation. °· Rectal bleeding. °· Leakage of stool (feces). °· Anal swelling. °· One or more lumps around the anus. ° °How is this diagnosed? °This condition can often be diagnosed through a visual exam. Other exams or tests may also be done, such as: °· Examination of the rectal area with a gloved hand (digital rectal exam). °· Examination of the anal canal using a small tube (anoscope). °· A blood test, if you have lost a  significant amount of blood. °· A test to look inside the colon (sigmoidoscopy or colonoscopy). ° °How is this treated? °This condition can usually be treated at home. However, various procedures may be done if dietary changes, lifestyle changes, and other home treatments do not help your symptoms. These procedures can help make the hemorrhoids smaller or remove them completely. Some of these procedures involve surgery, and others do not. Common procedures include: °· Rubber band ligation. Rubber bands are placed at the base of the hemorrhoids to cut off the blood supply to them. °· Sclerotherapy. Medicine is injected into the hemorrhoids to shrink them. °· Infrared coagulation. A type of light energy is used to get rid of the hemorrhoids. °· Hemorrhoidectomy surgery. The hemorrhoids are surgically removed, and the veins that supply them are tied off. °· Stapled hemorrhoidopexy surgery. A circular stapling device is used to remove the hemorrhoids and use staples to cut off the blood supply to them. ° °Follow these instructions at home: °Eating and drinking °· Eat foods that have a lot of fiber in them, such as whole grains, beans, nuts, fruits, and vegetables. Ask your health care provider about taking products that have added fiber (fiber supplements). °· Drink enough fluid to keep your urine clear or pale yellow. °Managing pain and swelling °· Take warm sitz baths for 20 minutes, 3-4 times a day to ease pain and discomfort. °· If directed, apply ice to the affected area. Using ice packs between sitz baths may be helpful. °? Put ice in a plastic bag. °? Place a towel between your skin and the bag. °? Leave the ice on for 20 minutes, 2-3 times a day. °General instructions °· Take over-the-counter and prescription medicines only as told by your health care provider. °· Use medicated creams or suppositories as told. °· Exercise regularly. °· Go to the bathroom when you have the urge to have a bowel movement. Do not  wait. °· Avoid straining to have bowel movements. °· Keep the anal area dry and clean. Use wet toilet paper or moist towelettes after a bowel movement. °· Do not sit on the toilet for long periods of time. This increases blood pooling and pain. °Contact a health care provider if: °· You have increasing pain and swelling that are not controlled by treatment or medicine. °· You have uncontrolled bleeding. °· You have difficulty having a bowel movement, or you are unable to have a bowel movement. °· You have pain or inflammation outside the area of the hemorrhoids. °This information is not intended to replace advice given to you by your health care provider. Make sure you discuss any questions you have with your health care provider. °Document Released: 07/21/2000 Document Revised: 12/22/2015 Document Reviewed: 04/07/2015 °Elsevier Interactive Patient Education © 2018 [employer] ° °

## 2018-03-04 NOTE — H&P (Signed)
Stephanie Valencia is an 57 y.o. female.   Chief Complaint: Patient is here for colonoscopy. HPI: Patient is a 57 year old Caucasian female who is here for screening colonoscopy.  She has a history of hemorrhoids and bleeds every now and then.  Her bowels every 2 to 3 days which has been her pattern.  She denies abdominal pain. Last colonoscopy was 10 years ago. Family history is negative for CRC.  Past Medical History:  Diagnosis Date  . Asthma    uses inhaler about twice a year    04/11/2013  . Hypothyroid   . 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    Marland Kitchen. Neutropenia, unspecified (HCC) 04/11/2013    Past Surgical History:  Procedure Laterality Date  . CESAREAN SECTION  1982  . CHOLECYSTECTOMY  2007  . COLONOSCOPY  2009  . THYROIDECTOMY, PARTIAL Left 1998  . TONSILLECTOMY  1962    History reviewed. No pertinent family history. Social History:  reports that she has never smoked. She has never used smokeless tobacco. She reports that she does not drink alcohol or use drugs.  Allergies:  Allergies  Allergen Reactions  . Bactrim [Sulfamethoxazole-Trimethoprim] Rash    Head to toe body rash  . 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    Medications Prior to Admission  Medication Sig Dispense Refill  . acetaminophen (TYLENOL) 500 MG tablet Take 500 mg by mouth every 6 (six) hours as needed for pain. Takes 2 as needed for headaches.  Does not take tylenol when taking ibuprofen.    Marland Kitchen. albuterol (PROVENTIL HFA;VENTOLIN HFA) 108 (90 BASE) MCG/ACT inhaler Inhale 2 puffs into the lungs as needed for wheezing.    Marland Kitchen. atorvastatin (LIPITOR) 10 MG tablet Take 10 mg by mouth daily.    . calcium carbonate (TUMS EX) 750 MG chewable tablet Chew 1 tablet by mouth as needed for heartburn.    . Chlorpheniramine Maleate (EQ CHLORTABS PO) Take 1 tablet by mouth as needed. Takes as needed for sinus    . cholecalciferol (VITAMIN D) 400 UNITS TABS Take 2,000 Units by mouth daily.     .  diphenhydramine-acetaminophen (TYLENOL PM) 25-500 MG TABS tablet Take 1 tablet by mouth at bedtime as needed (pain/sleep).     Marland Kitchen. ibuprofen (ADVIL,MOTRIN) 200 MG tablet Take 200 mg by mouth. Takes 3 tablets for headaches when needed  (does not take tylenol when taking Ibuprofen.    . levothyroxine (SYNTHROID, LEVOTHROID) 25 MCG tablet Take 25 mcg by mouth daily before breakfast.    . Lysine 500 MG TABS Take 1 tablet by mouth daily.     Marland Kitchen. MAGNESIUM PO Take 1 tablet by mouth daily.    . pantoprazole (PROTONIX) 40 MG tablet Take 1 tablet (40 mg total) by mouth daily. 90 tablet 3  . pramipexole (MIRAPEX) 0.25 MG tablet Take 0.375 mg by mouth at bedtime.     . simethicone (MYLICON) 80 MG chewable tablet Chew 80 mg by mouth every 6 (six) hours as needed for flatulence.      No results found for this or any previous visit (from the past 48 hour(s)). No results found.  ROS  Blood pressure 123/67, pulse 72, temperature 98.5 F (36.9 C), temperature source Oral, resp. rate 14, height 5\' 3"  (1.6 m), weight 160 lb (72.6 kg), SpO2 99 %. Physical Exam  Constitutional: She appears well-developed and well-nourished.  HENT:  Mouth/Throat: Oropharynx is clear and moist.  Eyes: Conjunctivae are  normal. No scleral icterus.  Neck: No thyromegaly present.  Cardiovascular: Normal rate, regular rhythm and normal heart sounds.  No murmur heard. Respiratory: Effort normal and breath sounds normal.  GI:  Abdomen is symmetrical with Pfannenstiel scar.  Abdomen is soft and nontender with organomegaly or masses.  Musculoskeletal: She exhibits no edema.  Lymphadenopathy:    She has no cervical adenopathy.  Neurological: She is alert.  Skin: Skin is warm and dry.     Assessment/Plan Average risk screening colonoscopy  Lionel December, MD 03/04/2018, 10:13 AM

## 2018-03-04 NOTE — Op Note (Signed)
[facility] Patient Name: Stephanie Valencia Procedure Date: 03/04/2018 9:58 AM MRN: [id number] Date of Birth: 08-07-1961 Attending MD: Lionel December , MD CSN: [id number] Age: 57 Admit Type: Outpatient Procedure:                Colonoscopy Indications:              Screening for colorectal malignant neoplasm Providers:                Lionel December, MD, Judee Clara, RN, Dyann Ruddle Referring MD:             Lenise Herald, PA Medicines:                Meperidine 50 mg IV, Midazolam 3 mg IV Complications:            No immediate complications. Estimated Blood Loss:     Estimated blood loss: none. Procedure:                Pre-Anesthesia Assessment:                           - Prior to the procedure, a History and Physical                            was performed, and patient medications and                            allergies were reviewed. The patient's tolerance of                            previous anesthesia was also reviewed. The risks                            and benefits of the procedure and the sedation                            options and risks were discussed with the patient.                            All questions were answered, and informed consent                            was obtained. Prior Anticoagulants: The patient                            last took ibuprofen 7 days prior to the procedure.                            ASA Grade Assessment: II - A patient with mild                            systemic disease. After reviewing the risks and                            benefits, the patient was deemed in satisfactory  condition to undergo the procedure.                           After obtaining informed consent, the colonoscope                            was passed under direct vision. Throughout the                            procedure, the patient's blood pressure, pulse, and                            oxygen saturations were monitored  continuously. The                            [id number]-[id number] ([id number]) scope was introduced through                            the anus and advanced to the the cecum, identified                            by appendiceal orifice and ileocecal valve. The                            colonoscopy was performed without difficulty. The                            patient tolerated the procedure well. The quality                            of the bowel preparation was good. Scope In: 10:22:20 AM Scope Out: 10:38:51 AM Scope Withdrawal Time: 0 hours 9 minutes 10 seconds  Total Procedure Duration: 0 hours 16 minutes 31 seconds  Findings:      The perianal and digital rectal examinations were normal.      The colon (entire examined portion) appeared normal.      External hemorrhoids were found during retroflexion. The hemorrhoids       were small. Impression:               - The entire examined colon is normal.                           - External hemorrhoids.                           - No specimens collected. Moderate Sedation:      Moderate (conscious) sedation was administered by the endoscopy nurse       and supervised by the endoscopist. The following parameters were       monitored: oxygen saturation, heart rate, blood pressure, CO2       capnography and response to care. Total physician intraservice time was       20 minutes. Recommendation:           - Patient has a contact number available for  emergencies. The signs and symptoms of potential                            delayed complications were discussed with the                            patient. Return to normal activities tomorrow.                            Written discharge instructions were provided to the                            patient.                           - Resume previous diet today.                           - Continue present medications.                           - Repeat colonoscopy in 10  years for screening                            purposes. Procedure Code(s):        --- Professional ---                           [phone], Colonoscopy, flexible; diagnostic, including                            collection of specimen(s) by brushing or washing,                            when performed (separate procedure)                           G0500, Moderate sedation services provided by the                            same physician or other qualified health care                            professional performing a gastrointestinal                            endoscopic service that sedation supports,                            requiring the presence of an independent trained                            observer to assist in the monitoring of the                            patient's level of consciousness and physiological  status; initial 15 minutes of intra-service time;                            patient age 64 years or older (additional time may                            be reported with [id number], as appropriate) Diagnosis Code(s):        --- Professional ---                           Z12.11, Encounter for screening for malignant                            neoplasm of colon                           K64.4, Residual hemorrhoidal skin tags CPT copyright 2017 American [employer] Association. All rights reserved. The codes documented in this report are preliminary and upon coder review may  be revised to meet current compliance requirements. Lionel DecemberNajeeb Rehman, MD Lionel DecemberNajeeb Rehman, MD 03/04/2018 10:45:38 AM This report has been signed electronically. Number of Addenda: 0

## 2018-03-07 ENCOUNTER — Encounter (HOSPITAL_COMMUNITY): Payer: Self-pay | Admitting: Internal Medicine

## 2018-03-22 ENCOUNTER — Ambulatory Visit (HOSPITAL_COMMUNITY)
Admission: RE | Admit: 2018-03-22 | Discharge: 2018-03-22 | Disposition: A | Discharge status: Home / Self Care | Payer: BC Managed Care – PPO | Source: Ambulatory Visit | Attending: Family Medicine | Admitting: Family Medicine

## 2018-03-22 ENCOUNTER — Encounter (HOSPITAL_COMMUNITY): Payer: Self-pay

## 2018-03-22 DIAGNOSIS — Z1231 Encounter for screening mammogram for malignant neoplasm of breast: Secondary | ICD-10-CM | POA: Diagnosis present

## 2018-12-19 ENCOUNTER — Other Ambulatory Visit: Payer: Self-pay

## 2018-12-19 ENCOUNTER — Ambulatory Visit (INDEPENDENT_AMBULATORY_CARE_PROVIDER_SITE_OTHER): Payer: BC Managed Care – PPO | Admitting: Internal Medicine

## 2018-12-19 ENCOUNTER — Encounter (INDEPENDENT_AMBULATORY_CARE_PROVIDER_SITE_OTHER): Payer: Self-pay | Admitting: Internal Medicine

## 2018-12-19 VITALS — BP 132/78 | HR 87 | Temp 98.5°F | Ht 63.5 in | Wt 177.1 lb

## 2018-12-19 DIAGNOSIS — K219 Gastro-esophageal reflux disease without esophagitis: Secondary | ICD-10-CM | POA: Diagnosis not present

## 2018-12-19 NOTE — Progress Notes (Signed)
Subjective:    Patient ID: Stephanie Valencia, female    DOB: Jan 10, 1961, 58 y.o.   MRN: [id number]  HPI Here today for f/u. Last seen in May of 2019. Hx of chronic GERD and maintained on Protonix. She tells me she is doing good. Her GERD is controlled with the Protonix. She says spicy foods really do not bother her. Greasy foods bother her and she will burp frequently. Her BMs are moving okay. Her appetite is okay. No weight loss.   Colonoscopy 03/04/2018 Colonoscopy: screening. Dr. Karilyn Cota.   Impression:               - The entire examined colon is normal.                           - External hemorrhoids.     Review of Systems Past Medical History:  Diagnosis Date  . Asthma    uses inhaler about twice a year  . Chronic leukopenia 04/11/2013  . Hypothyroid   . 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 Neutropenia (HCC)   . Neutropenia, unspecified (HCC) 04/11/2013    Past Surgical History:  Procedure Laterality Date  . CESAREAN SECTION  1982  . CHOLECYSTECTOMY  2007  . COLONOSCOPY  2009  . COLONOSCOPY N/A 03/04/2018   Procedure: COLONOSCOPY;  Surgeon: Malissa Hippo, MD;  Location: [facility];  Service: Endoscopy;  Laterality: N/A;  1030  . THYROIDECTOMY, PARTIAL Left 1998  . TONSILLECTOMY  1962    Allergies  Allergen Reactions  . Bactrim [Sulfamethoxazole-Trimethoprim] Rash    Head to toe body rash  . 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    Current Outpatient Medications on File Prior to Visit  Medication Sig Dispense Refill  . acetaminophen (TYLENOL) 500 MG tablet Take 500 mg by mouth every 6 (six) hours as needed for pain. Takes 2 as needed for headaches.  Does not take tylenol when taking ibuprofen.    Marland Kitchen albuterol (PROVENTIL HFA;VENTOLIN HFA) 108 (90 BASE) MCG/ACT inhaler Inhale 2 puffs into the lungs as needed for wheezing.    Marland Kitchen atorvastatin (LIPITOR) 10 MG tablet Take 10 mg by mouth daily.    . calcium carbonate (TUMS EX) 750 MG chewable tablet Chew  1 tablet by mouth as needed for heartburn.    . Chlorpheniramine Maleate (EQ CHLORTABS PO) Take 1 tablet by mouth as needed. Takes as needed for sinus    . cholecalciferol (VITAMIN D) 400 UNITS TABS Take 2,000 Units by mouth daily.     Marland Kitchen ibuprofen (ADVIL,MOTRIN) 200 MG tablet Take 200 mg by mouth. Takes 3 tablets for headaches when needed  (does not take tylenol when taking Ibuprofen.    . levothyroxine (SYNTHROID, LEVOTHROID) 25 MCG tablet Take 25 mcg by mouth daily before breakfast.    . Lysine 500 MG TABS Take 1 tablet by mouth daily.     Marland Kitchen MAGNESIUM PO Take 1 tablet by mouth daily.    . pantoprazole (PROTONIX) 40 MG tablet Take 1 tablet (40 mg total) by mouth daily. 90 tablet 3  . pramipexole (MIRAPEX) 0.25 MG tablet Take 0.375 mg by mouth at bedtime.     . simethicone (MYLICON) 80 MG chewable tablet Chew 80 mg by mouth every 6 (six) hours as needed for flatulence.     No current facility-administered medications on file prior to visit.  Objective:   Physical Exam  Blood pressure 132/78, pulse 87, temperature 98.5 F (36.9 C), height 5' 3.5" (1.613 m), weight 177 lb 1.6 oz (80.3 kg). Alert and oriented. Skin warm and dry. Oral mucosa is moist.   . Sclera anicteric, conjunctivae is pink. Thyroid not enlarged. No cervical lymphadenopathy. Lungs clear. Heart regular rate and rhythm.  Abdomen is soft. Bowel sounds are positive. No hepatomegaly. No abdominal masses felt. No tenderness.  No edema to lower extremities.       Assessment & Plan:  GERD. She will continue the Protonix. She is doing well. OV in 1 year

## 2018-12-19 NOTE — Patient Instructions (Signed)
Continue the Protonix. OV in 1 year.  

## 2019-01-15 ENCOUNTER — Other Ambulatory Visit (INDEPENDENT_AMBULATORY_CARE_PROVIDER_SITE_OTHER): Payer: Self-pay | Admitting: Internal Medicine

## 2019-01-15 DIAGNOSIS — K219 Gastro-esophageal reflux disease without esophagitis: Secondary | ICD-10-CM

## 2019-02-18 ENCOUNTER — Other Ambulatory Visit (HOSPITAL_COMMUNITY): Payer: Self-pay | Admitting: Physician Assistant

## 2019-02-18 DIAGNOSIS — Z1231 Encounter for screening mammogram for malignant neoplasm of breast: Secondary | ICD-10-CM

## 2019-03-31 ENCOUNTER — Ambulatory Visit (HOSPITAL_COMMUNITY)
Admission: RE | Admit: 2019-03-31 | Discharge: 2019-03-31 | Disposition: A | Discharge status: Home / Self Care | Payer: BC Managed Care – PPO | Source: Ambulatory Visit | Attending: Physician Assistant | Admitting: Physician Assistant

## 2019-03-31 ENCOUNTER — Other Ambulatory Visit: Payer: Self-pay

## 2019-03-31 DIAGNOSIS — Z1231 Encounter for screening mammogram for malignant neoplasm of breast: Secondary | ICD-10-CM

## 2019-10-05 ENCOUNTER — Ambulatory Visit: Payer: BC Managed Care – PPO | Attending: Internal Medicine

## 2019-10-05 ENCOUNTER — Other Ambulatory Visit: Payer: Self-pay

## 2019-10-05 DIAGNOSIS — Z23 Encounter for immunization: Secondary | ICD-10-CM | POA: Insufficient documentation

## 2019-10-05 NOTE — Progress Notes (Signed)
   Covid-19 Vaccination Clinic  Name:  Stephanie Valencia    MRN: [id number] DOB: Aug 26, 1960  10/05/2019  [name] was observed post Covid-19 immunization for 30 minutes based on pre-vaccination screening without incidence. She was provided with Vaccine Information Sheet and instruction to access the V-Safe system.   [name] was instructed to call 911 with any severe reactions post vaccine: Marland Kitchen Difficulty breathing  . Swelling of your face and throat  . A fast heartbeat  . A bad rash all over your body  . Dizziness and weakness    Immunizations Administered    Name Date Dose VIS Date Route   Moderna COVID-19 Vaccine 10/05/2019  9:38 AM 0.5 mL 07/08/2019 Intramuscular   Manufacturer: Moderna   Lot: [id number]   NDC: 70623-762-83

## 2019-11-08 ENCOUNTER — Ambulatory Visit: Payer: BC Managed Care – PPO | Attending: Internal Medicine

## 2019-11-08 DIAGNOSIS — Z23 Encounter for immunization: Secondary | ICD-10-CM

## 2019-11-08 NOTE — Progress Notes (Signed)
   Covid-19 Vaccination Clinic  Name:  Stephanie Valencia    MRN: [id number] DOB: August 05, 1961  11/08/2019  Stephanie Valencia was observed post Covid-19 immunization for 15 minutes without incident. She was provided with Vaccine Information Sheet and instruction to access the V-Safe system.   Stephanie Valencia was instructed to call 911 with any severe reactions post vaccine: Marland Kitchen Difficulty breathing  . Swelling of face and throat  . A fast heartbeat  . A bad rash all over body  . Dizziness and weakness   Immunizations Administered    Name Date Dose VIS Date Route   Moderna COVID-19 Vaccine 11/08/2019  9:45 AM 0.5 mL 07/08/2019 Intramuscular   Manufacturer: Moderna   Lot: [id number]   NDC: 22633-354-56

## 2020-01-19 ENCOUNTER — Other Ambulatory Visit (INDEPENDENT_AMBULATORY_CARE_PROVIDER_SITE_OTHER): Payer: Self-pay | Admitting: Gastroenterology

## 2020-01-19 DIAGNOSIS — K219 Gastro-esophageal reflux disease without esophagitis: Secondary | ICD-10-CM

## 2020-01-19 MED ORDER — PANTOPRAZOLE SODIUM 40 MG PO TBEC: 40.0000 mg | DELAYED_RELEASE_TABLET | Freq: Every day | ORAL | 0 refills | Status: DC

## 2020-01-19 NOTE — Progress Notes (Signed)
Protonix refill sent to pharmacy, patient is also due for her yearly office visit.

## 2020-03-04 ENCOUNTER — Other Ambulatory Visit (HOSPITAL_COMMUNITY): Payer: Self-pay | Admitting: Family Medicine

## 2020-03-04 DIAGNOSIS — Z1231 Encounter for screening mammogram for malignant neoplasm of breast: Secondary | ICD-10-CM

## 2020-03-31 ENCOUNTER — Encounter (INDEPENDENT_AMBULATORY_CARE_PROVIDER_SITE_OTHER): Payer: Self-pay | Admitting: Gastroenterology

## 2020-03-31 ENCOUNTER — Other Ambulatory Visit: Payer: Self-pay

## 2020-03-31 ENCOUNTER — Ambulatory Visit (INDEPENDENT_AMBULATORY_CARE_PROVIDER_SITE_OTHER): Payer: BC Managed Care – PPO | Admitting: Gastroenterology

## 2020-03-31 VITALS — BP 122/81 | HR 86 | Temp 98.9°F | Ht 63.0 in | Wt 169.2 lb

## 2020-03-31 DIAGNOSIS — K219 Gastro-esophageal reflux disease without esophagitis: Secondary | ICD-10-CM

## 2020-03-31 NOTE — Patient Instructions (Signed)
Can try pepcid as needed instead of Tums or GasX to see which provides most relief.  Try to take NSAID products w/ food.   GERD instructions: -Please avoid lying flat within 2 to 3 hours of eating, this will make reflux symptoms worse. -Some patients find elevating the head of the bed beneficial. -Avoid spicy greasy foods as well as caffeine, coffee, sodas-these food/drinks can worsen heartburn and reflux. -Tobacco will worsen reflux, please try to decrease/eliminate tobacco intake if applicable. -Avoid NSAID products (ibuprofen, aspirin, Advil, Aleve, Goody's, BCs, Alka-Seltzer) - if needing these occasionally please try to take with meal or snack to decrease stomach irritation. -If taking medication for reflux such as prilosec, nexium, aciphex, dexilant, prevacid - take 20-30 minutes before a meal for maximum effectiveness.

## 2020-03-31 NOTE — Progress Notes (Signed)
Patient profile: Stephanie Valencia is a 59 y.o. female seen for evaluation of GERD. She has chronic GERD and has been maintained in past on protonix.    History of Present Illness: Stephanie Valencia is seen today for her yearly follow-up.  She reports she weaned off Protonix 40 mg once a day about 1 to 2 months ago.  She did do 20 mg daily for 1 week before stopping.  She overall has done well.  She only has occasional issues with GERD after lettuce or greasy foods.  She does have some postnasal drip that can cause a globus sensation but this seems more related to allergies.  She uses Flonase usually but this more prominent if not taking Flonase.  She has never tried oral antihistamine, we discussed trial of this today..  She takes ibuprofen 2-3 times a week for headaches, usually on empty stomach.  She denies dysphagia, nausea, vomiting.  Bowel habits are fairly regular, slightly looser with stress.  Denies any significant constipation, diarrhea, lower abdominal pain, rectal bleeding.  Takes magnesium for leg cramps but does not feels this relates to loose stools   She is a high school Editor, commissioning.  Recently started back to school this week.   Wt Readings from Last 3 Encounters:  03/31/20 169 lb 3.2 oz (76.7 kg)  12/19/18 177 lb 1.6 oz (80.3 kg)  03/04/18 160 lb (72.6 kg)     Last Colonoscopy: 03/04/2018 Colonoscopy: screening. Dr. Karilyn Cota.  Impression:- The entire examined colon is normal. - External hemorrhoids.    Last Endoscopy: None prior   Past Medical History:  Past Medical History:  Diagnosis Date  . Asthma    uses inhaler about twice a year  . Chronic leukopenia 04/11/2013  . Hypothyroid   . 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 Neutropenia (HCC)   . Neutropenia, unspecified (HCC) 04/11/2013    Problem List: Patient Active Problem List   Diagnosis Date Noted  . Encounter for screening colonoscopy 01/29/2018  . GERD (gastroesophageal reflux disease) 09/09/2015  . Chronic  leukopenia 04/11/2013    Past Surgical History: Past Surgical History:  Procedure Laterality Date  . CESAREAN SECTION  1982  . CHOLECYSTECTOMY  2007  . COLONOSCOPY  2009  . COLONOSCOPY N/A 03/04/2018   Procedure: COLONOSCOPY;  Surgeon: Malissa Hippo, MD;  Location: [facility];  Service: Endoscopy;  Laterality: N/A;  1030  . THYROIDECTOMY, PARTIAL Left 1998  . TONSILLECTOMY  1962    Allergies: Allergies  Allergen Reactions  . Bactrim [Sulfamethoxazole-Trimethoprim] Rash    Head to toe body rash  . 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  . Sulfur     Patient states that all sulfur medications- she is allergic to.      Home Medications:  Current Outpatient Medications:  .  acetaminophen (TYLENOL) 500 MG tablet, Take 500 mg by mouth every 6 (six) hours as needed for pain. Takes 2 as needed for headaches.  Does not take tylenol when taking ibuprofen., Disp: , Rfl:  .  albuterol (PROVENTIL HFA;VENTOLIN HFA) 108 (90 BASE) MCG/ACT inhaler, Inhale 2 puffs into the lungs as needed for wheezing., Disp: , Rfl:  .  atorvastatin (LIPITOR) 10 MG tablet, Take 10 mg by mouth daily., Disp: , Rfl:  .  calcium carbonate (TUMS EX) 750 MG chewable tablet, Chew 1 tablet by mouth as needed for heartburn., Disp: , Rfl:  .  Chlorpheniramine Maleate (EQ CHLORTABS  PO), Take 1 tablet by mouth as needed. Takes as needed for sinus, Disp: , Rfl:  .  Cholecalciferol 125 MCG (5000 UT) capsule, Take 5,000 Units by mouth daily. , Disp: , Rfl:  .  ibuprofen (ADVIL,MOTRIN) 200 MG tablet, Take 200 mg by mouth. Takes 3 tablets for headaches when needed  (does not take tylenol when taking Ibuprofen., Disp: , Rfl:  .  levothyroxine (SYNTHROID, LEVOTHROID) 25 MCG tablet, Take 25 mcg by mouth daily before breakfast., Disp: , Rfl:  .  Lysine 1000 MG TABS, Take 1 tablet by mouth daily. , Disp: , Rfl:  .  MAGNESIUM PO, Take 250 mg by mouth daily. , Disp: , Rfl:  .  Potassium 99 MG TABS, Take by mouth daily.,  Disp: , Rfl:  .  pramipexole (MIRAPEX) 0.25 MG tablet, Take 0.375 mg by mouth at bedtime. , Disp: , Rfl:  .  simethicone (MYLICON) 80 MG chewable tablet, Chew 80 mg by mouth every 6 (six) hours as needed for flatulence., Disp: , Rfl:  .  pantoprazole (PROTONIX) 40 MG tablet, Take 1 tablet (40 mg total) by mouth daily. (Patient not taking: Reported on 03/31/2020), Disp: 90 tablet, Rfl: 0   Family History:  No family history colon cancer/colon polyps    Social History:   reports that she has never smoked. She has never used smokeless tobacco. She reports that she does not drink alcohol and does not use drugs.   Review of Systems: Constitutional: Denies weight loss/weight gain  Eyes: No changes in vision. ENT: No oral lesions, sore throat.  GI: see HPI.  Heme/Lymph: No easy bruising.  CV: No chest pain.  GU: No hematuria.  Integumentary: No rashes.  Neuro: No headaches.  Psych: No depression/anxiety.  Endocrine: No heat/cold intolerance.  Allergic/Immunologic: No urticaria.  Resp: No cough, SOB.  Musculoskeletal: No joint swelling.    Physical Examination: BP 122/81 (BP Location: Right Arm, Patient Position: Sitting, Cuff Size: Normal)   Pulse 86   Temp 98.9 F (37.2 C) (Oral)   Ht 5\' 3"  (1.6 m)   Wt 169 lb 3.2 oz (76.7 kg)   BMI 29.97 kg/m  Gen: NAD, alert and oriented x 4 HEENT: PEERLA, EOMI, Neck: supple, no JVD Chest: CTA bilaterally, no wheezes, crackles, or other adventitious sounds CV: RRR, no m/g/c/r Abd: soft, NT, ND, +BS in all four quadrants; no HSM, guarding, ridigity, or rebound tenderness Ext: no edema, well perfused with 2+ pulses, Skin: no rash or lesions noted on observed skin Lymph: no noted LAD  Data Reviewed:  Per patient PCP does labs yearly   Assessment/Plan: Stephanie Valencia is a 59 y.o. female seen for follow-up of  1.  GERD-she is now asymptomatic for the most part off PPI.  She has occasional dietary triggers.  We will discussed she can stay off  PPI and use on-demand therapy as needed for rare symptoms. Has found Gas-X and Tums helpful but also reviewed could try Pepcid.  Reviewed alarm symptoms to contact me with.  Currently taking NSAIDs on empty stomach-discussed decreasing and taking with food when needed  2.  Colon cancer screening-up-to-date, due 2029 unless develops symptoms or family history in interim.   Kataleena was seen today for follow-up.  Diagnoses and all orders for this visit:  Chronic GERD     Would like to follow-up as needed.   I personally performed the service, non-incident to. (WP)  [address], [facility]

## 2020-04-05 ENCOUNTER — Other Ambulatory Visit: Payer: Self-pay

## 2020-04-05 ENCOUNTER — Ambulatory Visit (HOSPITAL_COMMUNITY)
Admission: RE | Admit: 2020-04-05 | Discharge: 2020-04-05 | Disposition: A | Discharge status: Home / Self Care | Payer: BC Managed Care – PPO | Source: Ambulatory Visit | Attending: Family Medicine | Admitting: Family Medicine

## 2020-04-05 DIAGNOSIS — Z1231 Encounter for screening mammogram for malignant neoplasm of breast: Secondary | ICD-10-CM

## 2021-03-25 ENCOUNTER — Other Ambulatory Visit (HOSPITAL_COMMUNITY): Payer: Self-pay | Admitting: Family Medicine

## 2021-03-25 DIAGNOSIS — Z1231 Encounter for screening mammogram for malignant neoplasm of breast: Secondary | ICD-10-CM

## 2021-04-15 ENCOUNTER — Encounter: Payer: Self-pay | Admitting: Emergency Medicine

## 2021-04-15 ENCOUNTER — Ambulatory Visit
Admission: EM | Admit: 2021-04-15 | Discharge: 2021-04-15 | Disposition: A | Discharge status: Home / Self Care | Payer: BC Managed Care – PPO | Attending: Emergency Medicine | Admitting: Emergency Medicine

## 2021-04-15 ENCOUNTER — Other Ambulatory Visit: Payer: Self-pay

## 2021-04-15 DIAGNOSIS — R0981 Nasal congestion: Secondary | ICD-10-CM

## 2021-04-15 DIAGNOSIS — U071 COVID-19: Secondary | ICD-10-CM

## 2021-04-15 DIAGNOSIS — R059 Cough, unspecified: Secondary | ICD-10-CM

## 2021-04-15 DIAGNOSIS — J329 Chronic sinusitis, unspecified: Secondary | ICD-10-CM

## 2021-04-15 MED ORDER — BENZONATATE 100 MG PO CAPS: 100.0000 mg | ORAL_CAPSULE | Freq: Three times a day (TID) | ORAL | 0 refills | Status: DC

## 2021-04-15 MED ORDER — AMOXICILLIN-POT CLAVULANATE 875-125 MG PO TABS: 1.0000 | ORAL_TABLET | Freq: Two times a day (BID) | ORAL | 0 refills | Status: AC

## 2021-04-15 NOTE — ED Provider Notes (Signed)
[facility]   [id number] 04/15/21 Arrival Time: 4401   CC: congestion and cough  SUBJECTIVE: History from: patient.  Stephanie Valencia is a 60 y.o. female who presents with congestion, sore throat and productive cough x 2 weeks  Denies sick exposure to COVID, flu or strep.  Had positive for covid test.  Has tried OTC medication without relief.  Denies aggravating factors.  Reports previous symptoms in the past with PNA.   Denies fever, chills, SOB, wheezing, chest pain, nausea, changes in bowel or bladder habits.    ROS: As per HPI.  All other pertinent ROS negative.     Past Medical History:  Diagnosis Date   Asthma    uses inhaler about twice a year   Chronic leukopenia 04/11/2013   Hypothyroid    Meningitis 1962   cut down for IV's   Neutropenia (HCC)    Neutropenia, unspecified (HCC) 04/11/2013   Past Surgical History:  Procedure Laterality Date   CESAREAN SECTION  1982   CHOLECYSTECTOMY  2007   COLONOSCOPY  2009   COLONOSCOPY N/A 03/04/2018   Procedure: COLONOSCOPY;  Surgeon: Malissa Hippo, MD;  Location: [facility];  Service: Endoscopy;  Laterality: N/A;  1030   THYROIDECTOMY, PARTIAL Left 1998   TONSILLECTOMY  1962   Allergies  Allergen Reactions   Bactrim [Sulfamethoxazole-Trimethoprim] Rash    Head to toe body rash   Elemental Sulfur     Patient states that all sulfur medications- she is allergic to.   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   No current facility-administered medications on file prior to encounter.   Current Outpatient Medications on File Prior to Encounter  Medication Sig Dispense Refill   acetaminophen (TYLENOL) 500 MG tablet Take 500 mg by mouth every 6 (six) hours as needed for pain. Takes 2 as needed for headaches.  Does not take tylenol when taking ibuprofen.     albuterol (PROVENTIL HFA;VENTOLIN HFA) 108 (90 BASE) MCG/ACT inhaler Inhale 2 puffs into the lungs as needed for wheezing.     atorvastatin (LIPITOR) 10 MG  tablet Take 10 mg by mouth daily.     calcium carbonate (TUMS EX) 750 MG chewable tablet Chew 1 tablet by mouth as needed for heartburn.     Chlorpheniramine Maleate (EQ CHLORTABS PO) Take 1 tablet by mouth as needed. Takes as needed for sinus     Cholecalciferol 125 MCG (5000 UT) capsule Take 5,000 Units by mouth daily.      ibuprofen (ADVIL,MOTRIN) 200 MG tablet Take 200 mg by mouth. Takes 3 tablets for headaches when needed  (does not take tylenol when taking Ibuprofen.     levothyroxine (SYNTHROID, LEVOTHROID) 25 MCG tablet Take 25 mcg by mouth daily before breakfast.     Lysine 1000 MG TABS Take 1 tablet by mouth daily.      MAGNESIUM PO Take 250 mg by mouth daily.      pantoprazole (PROTONIX) 40 MG tablet Take 1 tablet (40 mg total) by mouth daily. (Patient not taking: Reported on 03/31/2020) 90 tablet 0   Potassium 99 MG TABS Take by mouth daily.     pramipexole (MIRAPEX) 0.25 MG tablet Take 0.375 mg by mouth at bedtime.      simethicone (MYLICON) 80 MG chewable tablet Chew 80 mg by mouth every 6 (six) hours as needed for flatulence.     Social History   Socioeconomic History   Marital status: Married  Spouse name: Not on file   Number of children: Not on file   Years of education: Not on file   Highest education level: Not on file  Occupational History   Not on file  Tobacco Use   Smoking status: Never   Smokeless tobacco: Never  Substance and Sexual Activity   Alcohol use: No   Drug use: No   Sexual activity: Not on file  Other Topics Concern   Not on file  Social History Narrative   Not on file   Social Determinants of Health   Financial Resource Strain: Not on file  Food Insecurity: Not on file  Transportation Needs: Not on file  Physical Activity: Not on file  Stress: Not on file  Social Connections: Not on file  Intimate Partner Violence: Not on file   No family history on file.  OBJECTIVE:  Vitals:   04/15/21 1001  BP: (!) 157/85  Pulse: 78  Resp:  17  Temp: 98.3 F (36.8 C)  TempSrc: Oral  SpO2: 97%  Weight: 163 lb (73.9 kg)     General appearance: alert; appears fatigued, but nontoxic; speaking in full sentences and tolerating own secretions HEENT: NCAT; Ears: EACs clear, TMs pearly gray; Eyes: PERRL.  EOM grossly intact. Nose: nares patent with mild rhinorrhea, Throat: oropharynx clear, tonsils non erythematous or enlarged, uvula midline  Neck: supple without LAD Lungs: unlabored respirations, symmetrical air entry; cough: mild; no respiratory distress; CTAB Heart: regular rate and rhythm.   Skin: warm and dry Psychological: alert and cooperative; normal mood and affect  ASSESSMENT & PLAN:  1. Sinus congestion   2. Chronic sinusitis, unspecified location   3. Cough   4. COVID-19 virus infection     Meds ordered this encounter  Medications   amoxicillin-clavulanate (AUGMENTIN) 875-125 MG tablet    Sig: Take 1 tablet by mouth every 12 (twelve) hours for 10 days.    Dispense:  20 tablet    Refill:  0    Order Specific Question:   Supervising Provider    Answer:   Eustace Moore [[id number]]   benzonatate (TESSALON) 100 MG capsule    Sig: Take 1 capsule (100 mg total) by mouth every 8 (eight) hours.    Dispense:  21 capsule    Refill:  0    Order Specific Question:   Supervising Provider    Answer:   Eustace Moore [[id number]]    Get plenty of rest and push fluids Augmentin for possible sinus infection Lungs sound clear today Tessalon perles for cough Use OTC zyrtec for nasal congestion, runny nose, and/or sore throat Use OTC flonase for nasal congestion and runny nose Use medications daily for symptom relief Use OTC medications like ibuprofen or tylenol as needed fever or pain Call or go to the ED if you have any new or worsening symptoms such as fever, worsening cough, shortness of breath, chest tightness, chest pain, turning blue, changes in mental status, etc...   Reviewed expectations re: course of  current medical issues. Questions answered. Outlined signs and symptoms indicating need for more acute intervention. Patient verbalized understanding. After Visit Summary given.          Rennis Harding, PA-C 04/15/21 1015

## 2021-04-15 NOTE — ED Triage Notes (Signed)
Congestion and sore throat, cough, no fevers.  Sinus medication and ibuprofen OTC, positive COVID test this am.

## 2021-04-15 NOTE — Discharge Instructions (Signed)
Get plenty of rest and push fluids Augmentin for possible sinus infection Lungs sound clear today Tessalon perles for cough Use OTC zyrtec for nasal congestion, runny nose, and/or sore throat Use OTC flonase for nasal congestion and runny nose Use medications daily for symptom relief Use OTC medications like ibuprofen or tylenol as needed fever or pain Call or go to the ED if you have any new or worsening symptoms such as fever, worsening cough, shortness of breath, chest tightness, chest pain, turning blue, changes in mental status, etc..Marland Kitchen

## 2021-04-18 ENCOUNTER — Ambulatory Visit (HOSPITAL_COMMUNITY)
Admission: RE | Admit: 2021-04-18 | Discharge: 2021-04-18 | Disposition: A | Discharge status: Home / Self Care | Payer: BC Managed Care – PPO | Source: Ambulatory Visit | Attending: Family Medicine | Admitting: Family Medicine

## 2021-04-18 ENCOUNTER — Other Ambulatory Visit: Payer: Self-pay

## 2021-04-18 DIAGNOSIS — Z1231 Encounter for screening mammogram for malignant neoplasm of breast: Secondary | ICD-10-CM | POA: Insufficient documentation

## 2022-03-20 ENCOUNTER — Other Ambulatory Visit (HOSPITAL_COMMUNITY): Payer: Self-pay | Admitting: Family Medicine

## 2022-03-20 DIAGNOSIS — Z1231 Encounter for screening mammogram for malignant neoplasm of breast: Secondary | ICD-10-CM

## 2022-04-24 ENCOUNTER — Ambulatory Visit (HOSPITAL_COMMUNITY)
Admission: RE | Admit: 2022-04-24 | Discharge: 2022-04-24 | Disposition: A | Discharge status: Home / Self Care | Payer: BC Managed Care – PPO | Source: Ambulatory Visit | Attending: Family Medicine | Admitting: Family Medicine

## 2022-04-24 DIAGNOSIS — Z1231 Encounter for screening mammogram for malignant neoplasm of breast: Secondary | ICD-10-CM | POA: Diagnosis not present

## 2023-04-19 ENCOUNTER — Other Ambulatory Visit (HOSPITAL_COMMUNITY): Payer: Self-pay | Admitting: Family Medicine

## 2023-04-19 DIAGNOSIS — Z1231 Encounter for screening mammogram for malignant neoplasm of breast: Secondary | ICD-10-CM

## 2023-04-30 ENCOUNTER — Ambulatory Visit (HOSPITAL_COMMUNITY)
Admission: RE | Admit: 2023-04-30 | Discharge: 2023-04-30 | Disposition: A | Discharge status: Home / Self Care | Payer: BC Managed Care – PPO | Source: Ambulatory Visit

## 2023-04-30 ENCOUNTER — Encounter (HOSPITAL_COMMUNITY): Payer: Self-pay

## 2023-04-30 DIAGNOSIS — Z1231 Encounter for screening mammogram for malignant neoplasm of breast: Secondary | ICD-10-CM | POA: Insufficient documentation

## 2024-04-29 NOTE — Progress Notes (Unsigned)
 Subjective:  Patient ID: Stephanie Valencia, female    DOB: 1961-03-27, 63 y.o.   MRN: [id number]  Patient Care Team: Deitra Morton Sebastian Nena, [place] as PCP - General (Nurse Practitioner)   Chief Complaint:  Establish Care   HPI: Stephanie Valencia is a 63 y.o. female presenting on 04/30/2024 for Establish Care   Discussed the use of AI scribe software for clinical note transcription with the patient, who gave verbal consent to proceed.  History of Present Illness Stephanie Valencia is a 63 year old female who presents to establish care with shoulder pain after a fall.  She has been experiencing shoulder pain for an extended period, which worsened after a fall four weeks ago when she tripped over concrete. The pain is elicited when she raises her arm high or applies pressure, with a pain level of four to five out of ten during these activities. There is a lack of full range of motion in the shoulder, but no constant pain or radiation. She did not have an x-ray following the fall and reports no head injury or loss of consciousness at that time.  Her current medications include levothyroxine in the morning, pramipexole for restless leg syndrome, and amitriptyline, which was initially prescribed for headaches but also helps with anxiety. She uses an inhaler as needed for asthma and takes TUMS occasionally for indigestion. She also takes vitamin D , lysine, magnesium, a multivitamin, and potassium supplements, the latter due to leg cramps. She prefers to use Tums for occasional indigestion instead of pantoprazole .  Her past medical history includes asthma, restless leg syndrome, anxiety, and a history of headaches. She has a family history of cancer, with her mother having passed away from lung cancer and her sister having had skin cancer. She denies smoking or alcohol use and reports feeling safe at home.  She mentions having undergone weight loss, which initially improved her cholesterol levels, but  acknowledges that her cholesterol may have increased again. She does not currently take any medication for cholesterol management.    Flowsheet Row Office Visit from 04/30/2024 in [facility]  PHQ-9 Total Score 0       04/30/2024    8:07 AM  GAD 7 : Generalized Anxiety Score  Nervous, Anxious, on Edge 0  Control/stop worrying 0  Worry too much - different things 0  Trouble relaxing 0  Restless 0  Easily annoyed or irritable 0  Afraid - awful might happen 0  Total GAD 7 Score 0  Anxiety Difficulty Not difficult at all       Relevant past medical, surgical, family, and social history reviewed and updated as indicated.  Allergies and medications reviewed and updated. Data reviewed: Chart in Epic.   Past Medical History:  Diagnosis Date   Asthma    uses inhaler about twice a year   Chronic leukopenia 04/11/2013   Hypothyroid    Meningitis 1962   cut down for IV's   Neutropenia    Neutropenia, unspecified 04/11/2013    Past Surgical History:  Procedure Laterality Date   CESAREAN SECTION  1982   CHOLECYSTECTOMY  2007   COLONOSCOPY  2009   COLONOSCOPY N/A 03/04/2018   Procedure: COLONOSCOPY;  Surgeon: Golda Claudis PENNER, MD;  Location: [facility];  Service: Endoscopy;  Laterality: N/A;  1030   THYROIDECTOMY, PARTIAL Left 1998   TONSILLECTOMY  1962    Social History   Socioeconomic History   Marital status:  Married    Spouse name: Not on file   Number of children: Not on file   Years of education: Not on file   Highest education level: Bachelor's degree (e.g., BA, AB, BS)  Occupational History   Not on file  Tobacco Use   Smoking status: Never   Smokeless tobacco: Never  Substance and Sexual Activity   Alcohol use: No   Drug use: No   Sexual activity: Not on file  Other Topics Concern   Not on file  Social History Narrative   Not on file   Social Drivers of Health   Financial Resource Strain: Low Risk  (04/29/2024)    Overall Financial Resource Strain (CARDIA)    Difficulty of Paying Living Expenses: Not hard at all  Food Insecurity: No Food Insecurity (04/29/2024)   Hunger Vital Sign    Worried About Running Out of Food in the Last Year: Never true    Ran Out of Food in the Last Year: Never true  Transportation Needs: No Transportation Needs (04/29/2024)   PRAPARE - Administrator, Civil Service (Medical): No    Lack of Transportation (Non-Medical): No  Physical Activity: Sufficiently Active (04/29/2024)   Exercise Vital Sign    Days of Exercise per Week: 6 days    Minutes of Exercise per Session: 60 min  Stress: No Stress Concern Present (04/29/2024)   [employer] of Occupational Health - Occupational Stress Questionnaire    Feeling of Stress: Not at all  Social Connections: Socially Integrated (04/29/2024)   Social Connection and Isolation Panel    Frequency of Communication with Friends and Family: More than three times a week    Frequency of Social Gatherings with Friends and Family: Once a week    Attends Religious Services: More than 4 times per year    Active Member of [employer] or Organizations: Yes    Attends Engineer, structural: More than 4 times per year    Marital Status: Married  Catering manager Violence: Not At Risk (04/30/2024)   Humiliation, Afraid, Rape, and Kick questionnaire    Fear of Current or Ex-Partner: No    Emotionally Abused: No    Physically Abused: No    Sexually Abused: No    Outpatient Encounter Medications as of 04/30/2024  Medication Sig   amitriptyline (ELAVIL) 10 MG tablet Take 10 mg by mouth daily.   atorvastatin (LIPITOR) 10 MG tablet Take 10 mg by mouth daily.   calcium carbonate (TUMS EX) 750 MG chewable tablet Chew 1 tablet by mouth as needed for heartburn.   Chlorpheniramine Maleate (EQ CHLORTABS PO) Take 1 tablet by mouth as needed. Takes as needed for sinus   Cholecalciferol 125 MCG (5000 UT) capsule Take 5,000 Units by mouth  daily.    ibuprofen (ADVIL,MOTRIN) 200 MG tablet Take 200 mg by mouth. Takes 3 tablets for headaches when needed  (does not take tylenol when taking Ibuprofen.   levothyroxine (SYNTHROID, LEVOTHROID) 25 MCG tablet Take 50 mcg by mouth daily before breakfast.   Lysine 1000 MG TABS Take 1 tablet by mouth daily.    MAGNESIUM PO Take 250 mg by mouth daily.    Multiple Vitamin (MULTIVITAMIN ADULT PO) Take by mouth.   Potassium 99 MG TABS Take by mouth daily.   pramipexole (MIRAPEX) 0.25 MG tablet Take 0.375 mg by mouth at bedtime.    [DISCONTINUED] Dextromethorphan-guaiFENesin (MUCINEX DM PO)    albuterol (PROVENTIL HFA;VENTOLIN HFA) 108 (90 BASE) MCG/ACT inhaler Inhale  2 puffs into the lungs as needed for wheezing. (Patient not taking: Reported on 04/30/2024)   [DISCONTINUED] acetaminophen (TYLENOL) 500 MG tablet Take 500 mg by mouth every 6 (six) hours as needed for pain. Takes 2 as needed for headaches.  Does not take tylenol when taking ibuprofen. (Patient not taking: Reported on 04/30/2024)   [DISCONTINUED] benzonatate  (TESSALON ) 100 MG capsule Take 1 capsule (100 mg total) by mouth every 8 (eight) hours.   [DISCONTINUED] pantoprazole  (PROTONIX ) 40 MG tablet Take 1 tablet (40 mg total) by mouth daily. (Patient not taking: Reported on 03/31/2020)   [DISCONTINUED] simethicone  (MYLICON) 80 MG chewable tablet Chew 80 mg by mouth every 6 (six) hours as needed for flatulence.   No facility-administered encounter medications on file as of 04/30/2024.    Allergies  Allergen Reactions   Bactrim [Sulfamethoxazole-Trimethoprim] Rash    Head to toe body rash   Elemental Sulfur     Patient states that all sulfur medications- she is allergic to.   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    Pertinent ROS per HPI, otherwise unremarkable      Objective:  BP 128/82   Pulse 71   Temp (!) 97.1 F (36.2 C) (Temporal)   Ht 5' 3 (1.6 m)   Wt 164 lb 12.8 oz (74.8 kg)   SpO2 97%   BMI 29.19  kg/m    Wt Readings from Last 3 Encounters:  04/30/24 164 lb 12.8 oz (74.8 kg)  04/15/21 163 lb (73.9 kg)  03/31/20 169 lb 3.2 oz (76.7 kg)    Physical Exam Vitals and nursing note reviewed.  Constitutional:      General: She is not in acute distress.    Appearance: Normal appearance.  HENT:     Head: Normocephalic and atraumatic.     Right Ear: Tympanic membrane, ear canal and external ear normal. There is no impacted cerumen.     Left Ear: Tympanic membrane, ear canal and external ear normal. There is no impacted cerumen.     Nose: Nose normal.     Mouth/Throat:     Mouth: Mucous membranes are moist.  Eyes:     General: No scleral icterus.    Extraocular Movements: Extraocular movements intact.     Conjunctiva/sclera: Conjunctivae normal.     Pupils: Pupils are equal, round, and reactive to light.  Cardiovascular:     Heart sounds: Normal heart sounds.  Pulmonary:     Effort: Pulmonary effort is normal.     Breath sounds: Normal breath sounds.  Abdominal:     General: Bowel sounds are normal.     Palpations: Abdomen is soft.     Tenderness: There is no abdominal tenderness.  Musculoskeletal:        General: Normal range of motion.     Right lower leg: No edema.     Left lower leg: No edema.  Skin:    General: Skin is warm and dry.     Findings: No rash.  Neurological:     Mental Status: She is alert and oriented to person, place, and time.  Psychiatric:        Attention and Perception: Attention and perception normal.        Mood and Affect: Mood and affect normal.        Speech: Speech normal.        Behavior: Behavior normal. Behavior is cooperative.        Thought Content: Thought content  normal. Thought content does not include homicidal or suicidal ideation. Thought content does not include homicidal or suicidal plan.        Cognition and Memory: Cognition and memory normal.    Physical Exam      Results for orders placed or performed in visit on  10/15/14  CBC   Collection Time: 10/15/14  3:45 PM  Result Value Ref Range   WBC 3.9 (L) 4.0 - 10.5 K/uL   RBC 4.41 3.87 - 5.11 MIL/uL   Hemoglobin 13.3 12.0 - 15.0 g/dL   HCT 59.6 63.9 - 53.9 %   MCV 91.4 78.0 - 100.0 fL   MCH 30.2 26.0 - 34.0 pg   MCHC 33.0 30.0 - 36.0 g/dL   RDW 87.0 88.4 - 84.4 %   Platelets 247 150 - 400 K/uL       Pertinent labs & imaging results that were available during my care of the patient were reviewed by me and considered in my medical decision making.  Assessment & Plan:  Stephanie Valencia was seen today for establish care.  Diagnoses and all orders for this visit:  Acute pain of right shoulder -     DG Shoulder Right  Hypothyroidism (acquired) -     Thyroid  Panel With TSH  Vitamin D  deficiency -     VITAMIN D  25 Hydroxy (Vit-D Deficiency, Fractures)  Gastroesophageal reflux disease without esophagitis -     CBC with Differential/Platelet  GAD (generalized anxiety disorder) -     Thyroid  Panel With TSH -     VITAMIN D  25 Hydroxy (Vit-D Deficiency, Fractures)  Encounter for general adult medical examination with abnormal findings -     CBC with Differential/Platelet -     Comprehensive metabolic panel with GFR -     Lipid panel -     Thyroid  Panel With TSH  Encounter for immunization -     Flu vaccine trivalent PF, 6mos and older(Flulaval,Afluria,Fluarix,Fluzone)    Stephanie Valencia is a 89 year old Caucasian female seen today to establish care, no acute distress Assessment and Plan Assessment & Plan Shoulder pain with reduced range of motion Chronic shoulder pain exacerbated by recent fall. Possible soft tissue injury or exacerbation of pre-existing condition. - Order shoulder X-ray to assess for underlying injury or abnormality. - Advise range of motion exercises, and heat pat 15 minutes every hour PRN.  Asthma Intermittent asthma managed with as-needed inhaler use.  Hypothyroidism Managed with levothyroxine.  Hyperlipidemia Previously managed  with lifestyle changes and weight loss. Currently not on statins. - Recommend over-the-counter fish oil supplement daily.  Restless legs syndrome Patient takes pramipexole for restless legs syndrome.  Anxiety disorder Anxiety managed with amitriptyline, initially prescribed for headaches.  Chronic leukopenia Chronic leukopenia noted in past medical history.  Left shoulder pain: Continue ibuprofen, heat 15 minutes in clinic as tolerated; x-ray ordered awaiting final report from radiology Labs: CBC, CMP, lipid, TSH, vitamin D  result pending Health maintenance: Flu vaccine administered today No medication refills needed today  Continue all other maintenance medications.  Follow up plan: Return in about 6 months (around 10/28/2024) for Chronic Diseases Management.   Continue healthy lifestyle choices, including diet (rich in fruits, vegetables, and lean proteins, and low in salt and simple carbohydrates) and exercise (at least 30 minutes of moderate physical activity daily).  Educational handout given for  Shoulder Pain Many things can cause shoulder pain, including: An injury. Moving the shoulder in the same way again and again (overuse). Joint pain (arthritis). Pain  can come from: Swelling and irritation (inflammation) of any part of the shoulder. An injury to: The shoulder joint. Tissues that connect muscle to bone (tendons). Tissues that connect bones to each other (ligaments). Bones. Follow these instructions at home: Watch for changes in your symptoms. Let your doctor know about them. Follow these instructions to help with your pain. If you have a sling that can be taken off: Wear the sling as told by your doctor. Take it off only as told by your doctor. Check the skin around the sling every day. Tell your doctor if you see problems. Loosen the sling if your fingers: Tingle. Become numb. Become cold. Keep the sling clean. If the sling is not waterproof: Do not let it  get wet. Take the sling off when you shower or bathe. Managing pain, stiffness, and swelling  If told, put ice on the painful area. Put ice in a plastic bag. Place a towel between your skin and the bag. Leave the ice on for 20 minutes, 2-3 times a day. Stop putting ice on if it does not help with the pain. If your skin turns bright red, take off the ice right away to prevent skin damage. The risk of damage is higher if you cannot feel pain, heat, or cold. Squeeze a soft ball or a foam pad as much as possible. This prevents swelling in the shoulder. It also helps to strengthen the arm. General instructions Take over-the-counter and prescription medicines only as told by your doctor. Keep all follow-up visits. This will help you avoid any type of permanent shoulder problems. Contact a doctor if: Your pain gets worse. Medicine does not help your pain. You have new pain in your arm, hand, or fingers. You loosen your sling and your arm, hand, or fingers: Tingle. Are numb. Are swollen. Get help right away if: Your arm, hand, or fingers turn white or blue. This information is not intended to replace advice given to you by your health care provider. Make sure you discuss any questions you have with your health care provider. Document Revised: 02/24/2022 Document Reviewed: 02/24/2022 Elsevier Patient Education  2024 [employer]. Preventive Care 39-5 Years Old, Female Preventive care refers to lifestyle choices and visits with your health care provider that can promote health and wellness. Preventive care visits are also called wellness exams. What can I expect for my preventive care visit? Counseling Your health care provider may ask you questions about your: Medical history, including: Past medical problems. Family medical history. Pregnancy history. Current health, including: Menstrual cycle. Method of birth control. Emotional well-being. Home life and relationship  well-being. Sexual activity and sexual health. Lifestyle, including: Alcohol, nicotine or tobacco, and drug use. Access to firearms. Diet, exercise, and sleep habits. Work and work Astronomer. Sunscreen use. Safety issues such as seatbelt and bike helmet use. Physical exam Your health care provider will check your: Height and weight. These may be used to calculate your BMI (body mass index). BMI is a measurement that tells if you are at a healthy weight. Waist circumference. This measures the distance around your waistline. This measurement also tells if you are at a healthy weight and may help predict your risk of certain diseases, such as type 2 diabetes and high blood pressure. Heart rate and blood pressure. Body temperature. Skin for abnormal spots. What immunizations do I need?  Vaccines are usually given at various ages, according to a schedule. Your health care provider will recommend vaccines for you based on  your age, medical history, and lifestyle or other factors, such as travel or where you work. What tests do I need? Screening Your health care provider may recommend screening tests for certain conditions. This may include: Lipid and cholesterol levels. Diabetes screening. This is done by checking your blood sugar (glucose) after you have not eaten for a while (fasting). Pelvic exam and Pap test. Hepatitis B test. Hepatitis C test. HIV (human immunodeficiency virus) test. STI (sexually transmitted infection) testing, if you are at risk. Lung cancer screening. Colorectal cancer screening. Mammogram. Talk with your health care provider about when you should start having regular mammograms. This may depend on whether you have a family history of breast cancer. BRCA-related cancer screening. This may be done if you have a family history of breast, ovarian, tubal, or peritoneal cancers. Bone density scan. This is done to screen for osteoporosis. Talk with your health care  provider about your test results, treatment options, and if necessary, the need for more tests. Follow these instructions at home: Eating and drinking  Eat a diet that includes fresh fruits and vegetables, whole grains, lean protein, and low-fat dairy products. Take vitamin and mineral supplements as recommended by your health care provider. Do not drink alcohol if: Your health care provider tells you not to drink. You are pregnant, may be pregnant, or are planning to become pregnant. If you drink alcohol: Limit how much you have to 0-1 drink a day. Know how much alcohol is in your drink. In the [place] one drink equals one 12 oz bottle of beer (355 mL), one 5 oz glass of wine (148 mL), or one 1 oz glass of hard liquor (44 mL). Lifestyle Brush your teeth every morning and night with fluoride toothpaste. Floss one time each day. Exercise for at least 30 minutes 5 or more days each week. Do not use any products that contain nicotine or tobacco. These products include cigarettes, chewing tobacco, and vaping devices, such as e-cigarettes. If you need help quitting, ask your health care provider. Do not use drugs. If you are sexually active, practice safe sex. Use a condom or other form of protection to prevent STIs. If you do not wish to become pregnant, use a form of birth control. If you plan to become pregnant, see your health care provider for a prepregnancy visit. Take aspirin only as told by your health care provider. Make sure that you understand how much to take and what form to take. Work with your health care provider to find out whether it is safe and beneficial for you to take aspirin daily. Find healthy ways to manage stress, such as: Meditation, yoga, or listening to music. Journaling. Talking to a trusted person. Spending time with friends and family. Minimize exposure to UV radiation to reduce your risk of skin cancer. Safety Always wear your seat belt while driving or riding in  a vehicle. Do not drive: If you have been drinking alcohol. Do not ride with someone who has been drinking. When you are tired or distracted. While texting. If you have been using any mind-altering substances or drugs. Wear a helmet and other protective equipment during sports activities. If you have firearms in your house, make sure you follow all gun safety procedures. Seek help if you have been physically or sexually abused. What's next? Visit your health care provider once a year for an annual wellness visit. Ask your health care provider how often you should have your eyes and teeth checked. Stay  up to date on all vaccines. This information is not intended to replace advice given to you by your health care provider. Make sure you discuss any questions you have with your health care provider. Document Revised: 01/19/2021 Document Reviewed: 01/19/2021 Elsevier Patient Education  2024 Elsevier Inc. Generalized Anxiety Disorder, Adult Generalized anxiety disorder (GAD) is a mental health condition. Unlike normal worries, anxiety related to GAD is not triggered by a specific event. These worries do not fade or get better with time. GAD interferes with relationships, work, and school. GAD symptoms can vary from mild to severe. People with severe GAD can have intense waves of anxiety with physical symptoms that are similar to panic attacks. What are the causes? The exact cause of GAD is not known, but the following are believed to have an impact: Differences in natural brain chemicals. Genes passed down from parents to children. Differences in the way threats are perceived. Development and stress during childhood. Personality. What increases the risk? The following factors may make you more likely to develop this condition: Being female. Having a family history of anxiety disorders. Being very shy. Experiencing very stressful life events, such as the death of a loved one. Having a very  stressful family environment. What are the signs or symptoms? People with GAD often worry excessively about many things in their lives, such as their health and family. Symptoms may also include: Mental and emotional symptoms: Worrying excessively about natural disasters. Fear of being late. Difficulty concentrating. Fears that others are judging your performance. Physical symptoms: Fatigue. Headaches, muscle tension, muscle twitches, trembling, or feeling shaky. Feeling like your heart is pounding or beating very fast. Feeling out of breath or like you cannot take a deep breath. Having trouble falling asleep or staying asleep, or experiencing restlessness. Sweating. Nausea, diarrhea, or irritable bowel syndrome (IBS). Behavioral symptoms: Experiencing erratic moods or irritability. Avoidance of new situations. Avoidance of people. Extreme difficulty making decisions. How is this diagnosed? This condition is diagnosed based on your symptoms and medical history. You will also have a physical exam. Your health care provider may perform tests to rule out other possible causes of your symptoms. To be diagnosed with GAD, a person must have anxiety that: Is out of his or her control. Affects several different aspects of his or her life, such as work and relationships. Causes distress that makes him or her unable to take part in normal activities. Includes at least three symptoms of GAD, such as restlessness, fatigue, trouble concentrating, irritability, muscle tension, or sleep problems. Before your health care provider can confirm a diagnosis of GAD, these symptoms must be present more days than they are not, and they must last for 6 months or longer. How is this treated? This condition may be treated with: Medicine. Antidepressant medicine is usually prescribed for long-term daily control. Anti-anxiety medicines may be added in severe cases, especially when panic attacks occur. Talk  therapy (psychotherapy). Certain types of talk therapy can be helpful in treating GAD by providing support, education, and guidance. Options include: Cognitive behavioral therapy (CBT). People learn coping skills and self-calming techniques to ease their physical symptoms. They learn to identify unrealistic thoughts and behaviors and to replace them with more appropriate thoughts and behaviors. Acceptance and commitment therapy (ACT). This treatment teaches people how to be mindful as a way to cope with unwanted thoughts and feelings. Biofeedback. This process trains you to manage your body's response (physiological response) through breathing techniques and relaxation methods. You will  work with a Paramedic while machines are used to monitor your physical symptoms. Stress management techniques. These include yoga, meditation, and exercise. A mental health specialist can help determine which treatment is best for you. Some people see improvement with one type of therapy. However, other people require a combination of therapies. Follow these instructions at home: Lifestyle Maintain a consistent routine and schedule. Anticipate stressful situations. Create a plan and allow extra time to work with your plan. Practice stress management or self-calming techniques that you have learned from your therapist or your health care provider. Exercise regularly and spend time outdoors. Eat a healthy diet that includes plenty of vegetables, fruits, whole grains, low-fat dairy products, and lean protein. Do not eat a lot of foods that are high in fat, added sugar, or salt (sodium). Drink plenty of water . Avoid alcohol. Alcohol can increase anxiety. Avoid caffeine and certain over-the-counter cold medicines. These may make you feel worse. Ask your pharmacist which medicines to avoid. General instructions Take over-the-counter and prescription medicines only as told by your health care provider. Understand that you  are likely to have setbacks. Accept this and be kind to yourself as you persist to take better care of yourself. Anticipate stressful situations. Create a plan and allow extra time to work with your plan. Recognize and accept your accomplishments, even if you judge them as small. Spend time with people who care about you. Keep all follow-up visits. This is important. Where to find more information [employer] of Mental Health: http://www.maynard.net/ Substance Abuse and Mental Health Services: SkateOasis.com.pt Contact a health care provider if: Your symptoms do not get better. Your symptoms get worse. You have signs of depression, such as: A persistently sad or irritable mood. Loss of enjoyment in activities that used to bring you joy. Change in weight or eating. Changes in sleeping habits. Get help right away if: You have thoughts about hurting yourself or others. If you ever feel like you may hurt yourself or others, or have thoughts about taking your own life, get help right away. Go to your nearest emergency department or: Call your local emergency services (911 in the [place] Call a suicide crisis helpline, such as the National Suicide Prevention Lifeline at 865-628-3548 or 988 in the [place] This is open 24 hours a day in the [place] If you're a Veteran: Call 988 and press 1. This is open 24 hours a day. Text the [employer] at (463)295-2661. Summary Generalized anxiety disorder (GAD) is a mental health condition that involves worry that is not triggered by a specific event. People with GAD often worry excessively about many things in their lives, such as their health and family. GAD may cause symptoms such as restlessness, trouble concentrating, sleep problems, frequent sweating, nausea, diarrhea, headaches, and trembling or muscle twitching. A mental health specialist can help determine which treatment is best for you. Some people see improvement with one type of therapy. However,  other people require a combination of therapies. This information is not intended to replace advice given to you by your health care provider. Make sure you discuss any questions you have with your health care provider. Document Revised: 03/08/2023 Document Reviewed: 11/14/2020 Elsevier Patient Education  2024 Elsevier Inc. Managing Anxiety, Adult After being diagnosed with anxiety, you may be relieved to know why you have felt or behaved a certain way. You may also feel overwhelmed about the treatment ahead and what it will mean for your life. With care and support, you can  manage your anxiety. How to manage lifestyle changes Understanding the difference between stress and anxiety Although stress can play a role in anxiety, it is not the same as anxiety. Stress is your body's reaction to life changes and events, both good and bad. Stress is often caused by something external, such as a deadline, test, or competition. It normally goes away after the event has ended and will last just a few hours. But, stress can be ongoing and can lead to more than just stress. Anxiety is caused by something internal, such as imagining a terrible outcome or worrying that something will go wrong that will greatly upset you. Anxiety often does not go away even after the event is over, and it can become a long-term (chronic) worry. Lowering stress and anxiety Talk with your health care provider or a counselor to learn more about lowering anxiety and stress. They may suggest tension-reduction techniques, such as: Music. Spend time creating or listening to music that you enjoy and that inspires you. Mindfulness-based meditation. Practice being aware of your normal breaths while not trying to control your breathing. It can be done while sitting or walking. Centering prayer. Focus on a word, phrase, or sacred image that means something to you and brings you peace. Deep breathing. Expand your stomach and inhale slowly  through your nose. Hold your breath for 3-5 seconds. Then breathe out slowly, letting your stomach muscles relax. Self-talk. Learn to notice and spot thought patterns that lead to anxiety reactions. Change those patterns to thoughts that feel peaceful. Muscle relaxation. Take time to tense muscles and then relax them. Choose a tension-reduction technique that fits your lifestyle and personality. These techniques take time and practice. Set aside 5-15 minutes a day to do them. Specialized therapists can offer counseling and training in these techniques. The training to help with anxiety may be covered by some insurance plans. Other things you can do to manage stress and anxiety include: Keeping a stress diary. This can help you learn what triggers your reaction and then learn ways to manage your response. Thinking about how you react to certain situations. You may not be able to control everything, but you can control your response. Making time for activities that help you relax and not feeling guilty about spending your time in this way. Doing visual imagery. This involves imagining or creating mental pictures to help you relax. Practicing yoga. Through yoga poses, you can lower tension and relax.   Medicines Medicines for anxiety include: Antidepressant medicines. These are usually prescribed for long-term daily control. Anti-anxiety medicines. These may be added in severe cases, especially when panic attacks occur. When used together, medicines, psychotherapy, and tension-reduction techniques may be the most effective treatment. Relationships Relationships can play a big part in helping you recover. Spend more time connecting with trusted friends and family members. Think about going to couples counseling if you have a partner, taking family education classes, or going to family therapy. Therapy can help you and others better understand your anxiety. How to recognize changes in your  anxiety Everyone responds differently to treatment for anxiety. Recovery from anxiety happens when symptoms lessen and stop interfering with your daily life at home or work. This may mean that you will start to: Have better concentration and focus. Worry will interfere less in your daily thinking. Sleep better. Be less irritable. Have more energy. Have improved memory. Try to recognize when your condition is getting worse. Contact your provider if your symptoms interfere with  home or work and you feel like your condition is not improving. Follow these instructions at home: Activity Exercise. Adults should: Exercise for at least 150 minutes each week. The exercise should increase your heart rate and make you sweat (moderate-intensity exercise). Do strengthening exercises at least twice a week. Get the right amount and quality of sleep. Most adults need 7-9 hours of sleep each night. Lifestyle  Eat a healthy diet that includes plenty of vegetables, fruits, whole grains, low-fat dairy products, and lean protein. Do not eat a lot of foods that are high in fats, added sugars, or salt (sodium). Make choices that simplify your life. Do not use any products that contain nicotine or tobacco. These products include cigarettes, chewing tobacco, and vaping devices, such as e-cigarettes. If you need help quitting, ask your provider. Avoid caffeine, alcohol, and certain over-the-counter cold medicines. These may make you feel worse. Ask your pharmacist which medicines to avoid. General instructions Take over-the-counter and prescription medicines only as told by your provider. Keep all follow-up visits. This is to make sure you are managing your anxiety well or if you need more support. Where to find support You can get help and support from: Self-help groups. Online and [employer]. A trusted spiritual leader. Couples counseling. Family education classes. Family therapy. Where to find  more information You may find that joining a support group helps you deal with your anxiety. The following sources can help you find counselors or support groups near you: Mental Health America: mentalhealthamerica.net Anxiety and Depression Association of [place] (ADAA): adaa.org The [employer] on Mental Illness (NAMI): nami.org Contact a health care provider if: You have a hard time staying focused or finishing tasks. You spend many hours a day feeling worried about everyday life. You are very tired because you cannot stop worrying. You start to have headaches or often feel tense. You have chronic nausea or diarrhea. Get help right away if: Your heart feels like it is racing. You have shortness of breath. You have thoughts of hurting yourself or others. Get help right away if you feel like you may hurt yourself or others, or have thoughts about taking your own life. Go to your nearest emergency room or: Call 911. Call the National Suicide Prevention Lifeline at (614)516-0163 or 988. This is open 24 hours a day. Text the Crisis Text Line at 289-071-4689. This information is not intended to replace advice given to you by your health care provider. Make sure you discuss any questions you have with your health care provider. Document Revised: 05/02/2022 Document Reviewed: 11/14/2020 Elsevier Patient Education  2024 Elsevier Inc. Preventing Vitamin D  Deficiency Vitamin D  deficiency is when your body doesn't have enough vitamin D . Vitamin D  is important because it helps your body maintain calcium and phosphorus levels. Vitamin D  is also important because: It helps keep bones and teeth healthy. It reduces irritation and swelling in the body (inflammation). It makes the body's defense system (immune system) strong. Our bodies make vitamin D  when our skin is exposed to direct sunlight. But for many people, this may not be enough vitamin D  to meet the body's needs. How can vitamin D  deficiency  affect me? If lack of vitamin D  is really bad: Your bones may become soft. In adults, this is called osteomalacia. In children, this is called rickets. Your bones may become weak or thin. This is called osteoporosis. What can increase my risk for vitamin D  deficiency? You may be at risk for vitamin D  deficiency  if: You're pregnant. You have too much body fat (obesity). You're an older adult. You have dark skin. You take medicines that affect the way vitamin D  is absorbed. You had surgery to remove a part of the stomach or a part of the small intestine. You may also be at risk if: You have a condition that makes it hard for you to absorb fat. This includes Crohn's disease, long-term (chronic) pancreatitis, or cystic fibrosis. You have certain conditions that are passed from parent to child (inherited). You don't have access to foods rich in vitamin D , or you follow a diet with little or no dairy, such as a vegan diet or lactose-free diet. You're not able to move or go outside safely. You live in areas that have fewer hours of sunlight. You spend most of your day indoors, or you cover your skin all the time when you're outdoors. What actions can I take to reduce my risk of vitamin D  deficiency? Know how much vitamin D  you need Different people need different amounts of vitamin D  daily: Infants: 400 international units (IU). Children older than 1 year: 600 IU. Adults: 600 IU. Pregnant and breastfeeding women: 600 IU. Adults older than 70 years: 800 IU. These are the minimum levels of recommended amounts. Your health care provider may tell you to take a different amount of vitamin D  based on your needs and your health. Know the best sources of vitamin D  You can meet your daily vitamin D  needs from: Direct exposure to natural sunlight. Foods. Dietary supplements. Infants can get their vitamin D  from infant formula. Get sun exposure Get regular, safe exposure to natural sunlight. Expose  your skin to direct sunlight for at least 15 minutes every day. If you have dark skin, you may need to expose your skin for a longer period of time. Protect your skin from too much sun exposure. This helps to prevent skin cancer. Ask your provider if regular sun exposure is safe for you. Do not use a tanning bed. Follow the right diet  Eat foods that naturally contain vitamin D . These include: Beef liver. Eggs. Vitamin D  is in the yolk. Fish, such as salmon or trout. Mushrooms that were treated with UV light. Eat or drink products that have vitamin D  added to them (fortified). These may include: Cereals. Milk, including plant-based milk such as almond, soy, or oat milks. Orange juice. Margarine. When choosing foods, check the food label on the package to see: How much vitamin D  is in the item. If the food is fortified with vitamin D . The items listed above may not be all the foods and drinks that have vitamin D . Talk with an expert in healthy eating (dietitian) to learn more. Take supplements and medicines If you're at risk for vitamin D  deficiency, or if you have certain diseases, your provider may recommend that you take a vitamin D  supplement. Make sure you: Talk with your provider before you start taking any vitamin D  supplements. You may have side effects of vitamin D  supplements if you're on certain medicines or have certain medical conditions. Take your medicines and supplments only as told. Tell your provider about all the medicines you're taking. These include vitamins, herbs, eye drops, creams, and over-the-counter medicines. To help your body to absorb vitamin D , take your supplement with a meal or snack. This information is not intended to replace advice given to you by your health care provider. Make sure you discuss any questions you have with your health  care provider. Document Revised: 11/08/2023 Document Reviewed: 11/08/2023 Elsevier Patient Education  2025 [employer]. Hypothyroidism  Hypothyroidism is when the thyroid  gland does not make enough of certain hormones. This is called an underactive thyroid . The thyroid  gland is a small gland located in the lower front part of the neck, just in front of the windpipe (trachea). This gland makes hormones that help control how the body uses food for energy (metabolism) as well as how the heart and brain function. These hormones also play a role in keeping your bones strong. When the thyroid  is underactive, it produces too little of the hormones thyroxine (T4) and triiodothyronine (T3). What are the causes? This condition may be caused by: Hashimoto's disease. This is a disease in which the body's disease-fighting system (immune system) attacks the thyroid  gland. This is the most common cause. Viral infections. Pregnancy. Certain medicines. Birth defects. Problems with a gland in the center of the brain (pituitary gland). Lack of enough iodine in the diet. Other causes may include: Past radiation treatments to the head or neck for cancer. Past treatment with radioactive iodine. Past exposure to radiation in the environment. Past surgical removal of part or all of the thyroid . What increases the risk? You are more likely to develop this condition if: You are female. You have a family history of thyroid  conditions. You use a medicine called lithium. You take medicines that affect the immune system (immunosuppressants). What are the signs or symptoms? Common symptoms of this condition include: Not being able to tolerate cold. Feeling as though you have no energy (lethargy). Lack of appetite. Constipation. Sadness or depression. Weight gain that is not explained by a change in diet or exercise habits. Menstrual irregularity. Dry skin, coarse hair, or brittle nails. Other symptoms may include: Muscle pain. Slowing of thought processes. Poor memory. How is this diagnosed? This condition may be  diagnosed based on: Your symptoms, your medical history, and a physical exam. Blood tests. You may also have imaging tests, such as an ultrasound or MRI. How is this treated? This condition is treated with medicine that replaces the thyroid  hormones that your body does not make. After you begin treatment, it may take several weeks for symptoms to go away. Follow these instructions at home: Take over-the-counter and prescription medicines only as told by your health care provider. If you start taking any new medicines, tell your health care provider. Keep all follow-up visits as told by your health care provider. This is important. As your condition improves, your dosage of thyroid  hormone medicine may change. You will need to have blood tests regularly so that your health care provider can monitor your condition. Contact a health care provider if: Your symptoms do not get better with treatment. You are taking thyroid  hormone replacement medicine and you: Sweat a lot. Have tremors. Feel anxious. Lose weight rapidly. Cannot tolerate heat. Have emotional swings. Have diarrhea. Feel weak. Get help right away if: You have chest pain. You have an irregular heartbeat. You have a rapid heartbeat. You have difficulty breathing. These symptoms may be an emergency. Get help right away. Call 911. Do not wait to see if the symptoms will go away. Do not drive yourself to the hospital. Summary Hypothyroidism is when the thyroid  gland does not make enough of certain hormones (it is underactive). When the thyroid  is underactive, it produces too little of the hormones thyroxine (T4) and triiodothyronine (T3). The most common cause is Hashimoto's disease, a disease in which  the body's disease-fighting system (immune system) attacks the thyroid  gland. The condition can also be caused by viral infections, medicine, pregnancy, or past radiation treatment to the head or neck. Symptoms may include weight  gain, dry skin, constipation, feeling as though you do not have energy, and not being able to tolerate cold. This condition is treated with medicine to replace the thyroid  hormones that your body does not make. This information is not intended to replace advice given to you by your health care provider. Make sure you discuss any questions you have with your health care provider. Document Revised: 07/26/2021 Document Reviewed: 07/26/2021 Elsevier Patient Education  2024 Elsevier Inc.  The above assessment and management plan was discussed with the patient. The patient verbalized understanding of and has agreed to the management plan. Patient is aware to call the clinic if they develop any new symptoms or if symptoms persist or worsen. Patient is aware when to return to the clinic for a follow-up visit. Patient educated on when it is appropriate to go to the emergency department.  Stephanie Mcneely [place] Thompson, DNP [facility] [address] [place], [place] [address] [phone]

## 2024-04-30 ENCOUNTER — Encounter: Payer: Self-pay | Admitting: Nurse Practitioner

## 2024-04-30 ENCOUNTER — Ambulatory Visit: Payer: Self-pay | Admitting: Nurse Practitioner

## 2024-04-30 ENCOUNTER — Ambulatory Visit (INDEPENDENT_AMBULATORY_CARE_PROVIDER_SITE_OTHER)

## 2024-04-30 ENCOUNTER — Other Ambulatory Visit (HOSPITAL_COMMUNITY): Payer: Self-pay | Admitting: Nurse Practitioner

## 2024-04-30 VITALS — BP 128/82 | HR 71 | Temp 97.1°F | Ht 63.0 in | Wt 164.8 lb

## 2024-04-30 DIAGNOSIS — E559 Vitamin D deficiency, unspecified: Secondary | ICD-10-CM | POA: Diagnosis not present

## 2024-04-30 DIAGNOSIS — Z1231 Encounter for screening mammogram for malignant neoplasm of breast: Secondary | ICD-10-CM

## 2024-04-30 DIAGNOSIS — K219 Gastro-esophageal reflux disease without esophagitis: Secondary | ICD-10-CM | POA: Diagnosis not present

## 2024-04-30 DIAGNOSIS — Z23 Encounter for immunization: Secondary | ICD-10-CM

## 2024-04-30 DIAGNOSIS — E039 Hypothyroidism, unspecified: Secondary | ICD-10-CM

## 2024-04-30 DIAGNOSIS — Z0001 Encounter for general adult medical examination with abnormal findings: Secondary | ICD-10-CM

## 2024-04-30 DIAGNOSIS — M25511 Pain in right shoulder: Secondary | ICD-10-CM

## 2024-04-30 DIAGNOSIS — F411 Generalized anxiety disorder: Secondary | ICD-10-CM

## 2024-04-30 LAB — CBC WITH DIFFERENTIAL/PLATELET

## 2024-05-01 ENCOUNTER — Other Ambulatory Visit

## 2024-05-01 ENCOUNTER — Ambulatory Visit: Payer: Self-pay | Admitting: Nurse Practitioner

## 2024-05-01 ENCOUNTER — Telehealth: Payer: Self-pay

## 2024-05-01 ENCOUNTER — Other Ambulatory Visit: Payer: Self-pay

## 2024-05-01 DIAGNOSIS — D72819 Decreased white blood cell count, unspecified: Secondary | ICD-10-CM

## 2024-05-01 LAB — LIPID PANEL
Chol/HDL Ratio: 5.3 ratio — ABNORMAL HIGH (ref 0.0–4.4)
Cholesterol, Total: 273 mg/dL — ABNORMAL HIGH (ref 100–199)
HDL: 52 mg/dL (ref >39)
LDL Chol Calc (NIH): 192 mg/dL — ABNORMAL HIGH (ref 0–99)
Triglycerides: 159 mg/dL — ABNORMAL HIGH (ref 0–149)
VLDL Cholesterol Cal: 29 mg/dL (ref 5–40)

## 2024-05-01 LAB — VITAMIN D 25 HYDROXY (VIT D DEFICIENCY, FRACTURES): Vit D, 25-Hydroxy: 54.3 ng/mL (ref 30.0–100.0)

## 2024-05-01 LAB — COMPREHENSIVE METABOLIC PANEL WITH GFR
ALT: 17 IU/L (ref 0–32)
AST: 20 IU/L (ref 0–40)
Albumin: 4.7 g/dL (ref 3.9–4.9)
Alkaline Phosphatase: 70 IU/L (ref 49–135)
BUN/Creatinine Ratio: 11 — ABNORMAL LOW (ref 12–28)
BUN: 10 mg/dL (ref 8–27)
Bilirubin Total: 1 mg/dL (ref 0.0–1.2)
CO2: 23 mmol/L (ref 20–29)
Calcium: 9.9 mg/dL (ref 8.7–10.3)
Chloride: 105 mmol/L (ref 96–106)
Creatinine, Ser: 0.92 mg/dL (ref 0.57–1.00)
Globulin, Total: 2.5 g/dL (ref 1.5–4.5)
Glucose: 95 mg/dL (ref 70–99)
Potassium: 4.4 mmol/L (ref 3.5–5.2)
Sodium: 144 mmol/L (ref 134–144)
Total Protein: 7.2 g/dL (ref 6.0–8.5)
eGFR: 70 mL/min/1.73 (ref >59)

## 2024-05-01 LAB — CBC WITH DIFFERENTIAL/PLATELET
Basos: 1 %
EOS (ABSOLUTE): 0 x10E3/uL (ref 0.0–0.2)
Eos: 3 %
Hematocrit: 47.4 % — AB (ref 34.0–46.6)
Hemoglobin: 15.5 g/dL (ref 11.1–15.9)
Immature Granulocytes: 0 %
Immature Granulocytes: 0 x10E3/uL (ref 0.0–0.1)
Lymphs: 45 %
MCH: 29.8 pg (ref 26.6–33.0)
MCHC: 32.7 g/dL (ref 31.5–35.7)
MCV: 91 fL (ref 79–97)
Monocytes Absolute: 0.1 x10E3/uL (ref 0.0–0.4)
Monocytes Absolute: 0.3 x10E3/uL (ref 0.1–0.9)
Monocytes: 8 %
Neutrophils Absolute: 1.3 x10E3/uL — AB (ref 1.4–7.0)
Neutrophils Absolute: 1.4 x10E3/uL (ref 0.7–3.1)
Neutrophils: 43 %
Platelets: 277 x10E3/uL (ref 150–450)
RBC: 5.2 x10E6/uL (ref 3.77–5.28)
RDW: 12.8 % (ref 11.7–15.4)
WBC: 3.1 x10E3/uL — AB (ref 3.4–10.8)

## 2024-05-01 LAB — THYROID PANEL WITH TSH
Free Thyroxine Index: 2.3 (ref 1.2–4.9)
T3 Uptake Ratio: 25 % (ref 24–39)
T4, Total: 9.2 ug/dL (ref 4.5–12.0)
TSH: 3.22 u[IU]/mL (ref 0.450–4.500)

## 2024-05-01 NOTE — Telephone Encounter (Signed)
 Left vm for pt to callback

## 2024-05-01 NOTE — Telephone Encounter (Signed)
 Copied from CRM #[id number]. Topic: Clinical - Medication Question >> May 01, 2024  3:34 PM Lauren C wrote: Reason for CRM: I relayed lab results to patient. Stephanie Valencia says in the relayed message that she would like to increase her Lipitor to 20 mg daily. Patient states she is no longer taking Lipitor at all and would prefer to stay off it. Please call pt back if she needs to start on another medication or restart Lipitor. No further questions about labs.

## 2024-05-02 ENCOUNTER — Ambulatory Visit (HOSPITAL_COMMUNITY)
Admission: RE | Admit: 2024-05-02 | Discharge: 2024-05-02 | Disposition: A | Discharge status: Home / Self Care | Source: Ambulatory Visit | Attending: Nurse Practitioner | Admitting: Nurse Practitioner

## 2024-05-02 DIAGNOSIS — Z1231 Encounter for screening mammogram for malignant neoplasm of breast: Secondary | ICD-10-CM | POA: Insufficient documentation

## 2024-05-02 LAB — B12 AND FOLATE PANEL
Folate: >20 ng/mL (ref >3.0)
Vitamin B-12: 643 pg/mL (ref 232–1245)

## 2024-05-05 ENCOUNTER — Ambulatory Visit: Payer: Self-pay | Admitting: Nurse Practitioner

## 2024-05-06 ENCOUNTER — Ambulatory Visit: Payer: Self-pay | Admitting: Nurse Practitioner

## 2024-05-19 NOTE — Telephone Encounter (Signed)
See other encounter for update

## 2024-05-26 ENCOUNTER — Other Ambulatory Visit: Payer: Self-pay | Admitting: Nurse Practitioner

## 2024-05-26 MED ORDER — PRAMIPEXOLE DIHYDROCHLORIDE 0.25 MG PO TABS: 0.3750 mg | ORAL_TABLET | Freq: Every day | ORAL | 0 refills | Status: DC

## 2024-08-23 ENCOUNTER — Other Ambulatory Visit: Payer: Self-pay | Admitting: Nurse Practitioner

## 2024-10-28 ENCOUNTER — Ambulatory Visit: Payer: Self-pay | Admitting: Nurse Practitioner

## 2024-10-28 ENCOUNTER — Encounter: Admitting: Family Medicine
